# Patient Record
Sex: Female | Born: 1950 | Race: Black or African American | Hispanic: No | Marital: Married | Smoking: Never smoker
Health system: Southern US, Community
[De-identification: ages and names within clinical notes are randomized; demographics above are authoritative.]

## PROBLEM LIST (undated history)

## (undated) DIAGNOSIS — J302 Other seasonal allergic rhinitis: Secondary | ICD-10-CM

## (undated) DIAGNOSIS — N950 Postmenopausal bleeding: Secondary | ICD-10-CM

## (undated) DIAGNOSIS — D219 Benign neoplasm of connective and other soft tissue, unspecified: Secondary | ICD-10-CM

## (undated) DIAGNOSIS — Z789 Other specified health status: Secondary | ICD-10-CM

## (undated) DIAGNOSIS — G43909 Migraine, unspecified, not intractable, without status migrainosus: Secondary | ICD-10-CM

## (undated) DIAGNOSIS — M1990 Unspecified osteoarthritis, unspecified site: Secondary | ICD-10-CM

## (undated) DIAGNOSIS — B019 Varicella without complication: Secondary | ICD-10-CM

## (undated) DIAGNOSIS — T7840XA Allergy, unspecified, initial encounter: Secondary | ICD-10-CM

## (undated) HISTORY — DX: Migraine, unspecified, not intractable, without status migrainosus: G43.909

## (undated) HISTORY — DX: Unspecified osteoarthritis, unspecified site: M19.90

## (undated) HISTORY — DX: Benign neoplasm of connective and other soft tissue, unspecified: D21.9

## (undated) HISTORY — DX: Postmenopausal bleeding: N95.0

## (undated) HISTORY — DX: Varicella without complication: B01.9

## (undated) HISTORY — PX: ENDOMETRIAL BIOPSY: SHX622

## (undated) HISTORY — PX: MYOMECTOMY: SHX85

## (undated) HISTORY — PX: WISDOM TOOTH EXTRACTION: SHX21

## (undated) HISTORY — DX: Allergy, unspecified, initial encounter: T78.40XA

---

## 1998-07-06 ENCOUNTER — Other Ambulatory Visit: Admission: RE | Admit: 1998-07-06 | Discharge: 1998-07-06 | Payer: Self-pay | Admitting: Obstetrics and Gynecology

## 2000-01-08 ENCOUNTER — Encounter (INDEPENDENT_AMBULATORY_CARE_PROVIDER_SITE_OTHER): Payer: Self-pay | Admitting: Specialist

## 2000-01-08 ENCOUNTER — Other Ambulatory Visit: Admission: RE | Admit: 2000-01-08 | Discharge: 2000-01-08 | Payer: Self-pay | Admitting: Obstetrics and Gynecology

## 2001-10-09 ENCOUNTER — Other Ambulatory Visit: Admission: RE | Admit: 2001-10-09 | Discharge: 2001-10-09 | Payer: Self-pay | Admitting: Obstetrics and Gynecology

## 2004-06-03 LAB — HM COLONOSCOPY

## 2004-10-08 ENCOUNTER — Other Ambulatory Visit: Admission: RE | Admit: 2004-10-08 | Discharge: 2004-10-08 | Payer: Self-pay | Admitting: Obstetrics and Gynecology

## 2004-10-15 ENCOUNTER — Ambulatory Visit: Payer: Self-pay | Admitting: Family Medicine

## 2004-10-15 ENCOUNTER — Encounter: Admission: RE | Admit: 2004-10-15 | Discharge: 2004-10-15 | Payer: Self-pay | Admitting: Family Medicine

## 2004-10-23 ENCOUNTER — Ambulatory Visit: Payer: Self-pay

## 2004-11-07 ENCOUNTER — Ambulatory Visit: Payer: Self-pay | Admitting: Family Medicine

## 2005-01-07 ENCOUNTER — Ambulatory Visit: Payer: Self-pay | Admitting: Family Medicine

## 2005-10-09 ENCOUNTER — Other Ambulatory Visit: Admission: RE | Admit: 2005-10-09 | Discharge: 2005-10-09 | Payer: Self-pay | Admitting: Obstetrics and Gynecology

## 2009-06-15 ENCOUNTER — Ambulatory Visit (HOSPITAL_BASED_OUTPATIENT_CLINIC_OR_DEPARTMENT_OTHER): Admission: RE | Admit: 2009-06-15 | Discharge: 2009-06-15 | Payer: Self-pay | Admitting: Ophthalmology

## 2011-04-04 HISTORY — PX: HYSTEROSCOPY: SHX211

## 2011-04-15 ENCOUNTER — Other Ambulatory Visit: Payer: Self-pay | Admitting: Obstetrics and Gynecology

## 2011-04-18 ENCOUNTER — Encounter (HOSPITAL_COMMUNITY): Payer: Self-pay | Admitting: *Deleted

## 2011-04-19 ENCOUNTER — Encounter (HOSPITAL_COMMUNITY): Payer: Self-pay | Admitting: Pharmacy Technician

## 2011-04-22 NOTE — H&P (Signed)
NAMEJAYCIE, KREGEL              ACCOUNT NO.:  [id number]  MEDICAL RECORD NO.:  [id number]  LOCATION:  PERIO                         FACILITY:  [facility]  PHYSICIAN:  Janine Limbo, M.D.DATE OF BIRTH:  1951/03/13  DATE OF ADMISSION:  04/15/2011 DATE OF DISCHARGE:                             HISTORY & PHYSICAL   HISTORY OF PRESENT ILLNESS:  Ms. Mcreynolds is a 60 year old female, para 0-1-1-1, who presents for hysteroscopy with resection.  She will also have a dilatation and curettage.  The patient has been followed at the [facility] and Gy[facility]cology [facility] of [facility].  She complains of postmenopausal bleeding.  A hydrosonogram showed a 7.48 x 4.75 cm uterus.  Fibroids were noted with the largest fibroid measuring 3.5 cm.  An endometrial biopsy was within normal limits.  The patient was found to have an endometrial polyp.  A large endocervical polyp was removed.  The patient had a negative endometrial biopsy in 2008.  Her most recent Pap smear was within normal limits.  OBSTETRICAL HISTORY:  The patient has had 1 preterm vaginal delivery and she has had 1 miscarriage.  SOCIAL HISTORY:  The patient denies cigarette use, alcohol use, and recreational drug use.  PAST MEDICAL HISTORY:  The patient has had her wisdom teeth removed. She wears glasses.  FAMILY HISTORY:  Noncontributory.  DRUG ALLERGIES:  No known drug allergies.  PHYSICAL EXAMINATION:  VITAL SIGNS:  Height is 5 feet 3 inches.  Weight is 194 pounds. HEENT:  Within normal limits. CHEST:  Clear. HEART:  Regular rate and rhythm. BREASTS:  Without masses. ABDOMEN:  Nontender. EXTREMITIES:  Grossly normal. NEUROLOGIC:  Grossly normal. PELVIC:  External genitalia is normal.  The vagina is normal.  The cervix is nontender.  A large endocervical polyp was present but this has now been removed.  The uterus is 8-10 weeks size and irregular. Adnexa, no masses and rectovaginal exam  confirms.  ASSESSMENT: 1. Postmenopausal bleeding. 2. Fibroid uterus. 3. Endometrial polyp.  PLAN:  The patient will undergo hysteroscopy with resection of endometrial polyps.  She will also have a dilatation and curettage.  She understands the indications for her surgical procedure, and she accepts the risks of, but not limited to, anesthetic complications, bleeding, infections, and possible damage to the surrounding organs.     Janine Limbo, M.D.     AVS/MEDQ  D:  04/22/2011  T:  04/22/2011  Job:  [id number]

## 2011-04-24 ENCOUNTER — Other Ambulatory Visit: Payer: Self-pay | Admitting: Obstetrics and Gynecology

## 2011-04-24 ENCOUNTER — Encounter (HOSPITAL_COMMUNITY)
Admission: RE | Disposition: A | Discharge status: Home / Self Care | Payer: Self-pay | Source: Ambulatory Visit | Attending: Obstetrics and Gynecology

## 2011-04-24 ENCOUNTER — Ambulatory Visit (HOSPITAL_COMMUNITY): Payer: BC Managed Care – PPO | Admitting: Anesthesiology

## 2011-04-24 ENCOUNTER — Encounter (HOSPITAL_COMMUNITY): Payer: Self-pay | Admitting: *Deleted

## 2011-04-24 ENCOUNTER — Ambulatory Visit (HOSPITAL_COMMUNITY)
Admission: RE | Admit: 2011-04-24 | Discharge: 2011-04-24 | Disposition: A | Discharge status: Home / Self Care | Payer: BC Managed Care – PPO | Source: Ambulatory Visit | Attending: Obstetrics and Gynecology | Admitting: Obstetrics and Gynecology

## 2011-04-24 ENCOUNTER — Encounter (HOSPITAL_COMMUNITY): Payer: Self-pay | Admitting: Anesthesiology

## 2011-04-24 DIAGNOSIS — D259 Leiomyoma of uterus, unspecified: Secondary | ICD-10-CM | POA: Insufficient documentation

## 2011-04-24 DIAGNOSIS — N840 Polyp of corpus uteri: Secondary | ICD-10-CM | POA: Insufficient documentation

## 2011-04-24 DIAGNOSIS — N950 Postmenopausal bleeding: Secondary | ICD-10-CM | POA: Insufficient documentation

## 2011-04-24 HISTORY — DX: Other seasonal allergic rhinitis: J30.2

## 2011-04-24 HISTORY — DX: Other specified health status: Z78.9

## 2011-04-24 LAB — CBC
HCT: 41.2 % (ref 36.0–46.0)
MCH: 29.8 pg (ref 26.0–34.0)
MCHC: 32.3 g/dL (ref 30.0–36.0)
MCV: 92.2 fL (ref 78.0–100.0)
Platelets: 283 10*3/uL (ref 150–400)
RDW: 14.3 % (ref 11.5–15.5)

## 2011-04-24 SURGERY — DILATATION & CURETTAGE/HYSTEROSCOPY WITH RESECTOCOPE
Anesthesia: General | Site: Vagina | Wound class: Clean Contaminated

## 2011-04-24 MED ORDER — MIDAZOLAM HCL 5 MG/5ML IJ SOLN
INTRAMUSCULAR | Status: DC | PRN
  Administered 2011-04-24: 2 mg via INTRAVENOUS

## 2011-04-24 MED ORDER — FENTANYL CITRATE 0.05 MG/ML IJ SOLN: 25.0000 ug | INTRAMUSCULAR | Status: DC | PRN

## 2011-04-24 MED ORDER — DEXAMETHASONE SODIUM PHOSPHATE 10 MG/ML IJ SOLN
INTRAMUSCULAR | Status: AC
  Filled 2011-04-24: qty 1

## 2011-04-24 MED ORDER — MIDAZOLAM HCL 2 MG/2ML IJ SOLN
INTRAMUSCULAR | Status: AC
  Filled 2011-04-24: qty 2

## 2011-04-24 MED ORDER — IBUPROFEN 200 MG PO TABS: 800.0000 mg | ORAL_TABLET | Freq: Three times a day (TID) | ORAL | Status: AC | PRN

## 2011-04-24 MED ORDER — ACETAMINOPHEN 325 MG PO TABS: 325.0000 mg | ORAL_TABLET | ORAL | Status: DC | PRN

## 2011-04-24 MED ORDER — KETOROLAC TROMETHAMINE 60 MG/2ML IM SOLN
INTRAMUSCULAR | Status: DC | PRN
  Administered 2011-04-24: 30 mg via INTRAMUSCULAR

## 2011-04-24 MED ORDER — BUPIVACAINE-EPINEPHRINE 0.5% -1:200000 IJ SOLN
INTRAMUSCULAR | Status: DC | PRN
  Administered 2011-04-24: 10 mL

## 2011-04-24 MED ORDER — PROPOFOL 10 MG/ML IV EMUL
INTRAVENOUS | Status: DC | PRN
  Administered 2011-04-24: 200 mg via INTRAVENOUS
  Administered 2011-04-24: 100 mg via INTRAVENOUS

## 2011-04-24 MED ORDER — FENTANYL CITRATE 0.05 MG/ML IJ SOLN
INTRAMUSCULAR | Status: DC | PRN
  Administered 2011-04-24: 100 ug via INTRAVENOUS

## 2011-04-24 MED ORDER — PROMETHAZINE HCL 25 MG/ML IJ SOLN: 6.2500 mg | INTRAMUSCULAR | Status: DC | PRN

## 2011-04-24 MED ORDER — LACTATED RINGERS IV SOLN
INTRAVENOUS | Status: DC
  Administered 2011-04-24: 50 mL/h via INTRAVENOUS
  Administered 2011-04-24: 125 mL/h via INTRAVENOUS
  Administered 2011-04-24: 14:00:00 via INTRAVENOUS

## 2011-04-24 MED ORDER — GLYCOPYRROLATE 0.2 MG/ML IJ SOLN
INTRAMUSCULAR | Status: DC | PRN
  Administered 2011-04-24: .4 mg via INTRAVENOUS

## 2011-04-24 MED ORDER — ONDANSETRON HCL 4 MG/2ML IJ SOLN
INTRAMUSCULAR | Status: AC
  Filled 2011-04-24: qty 2

## 2011-04-24 MED ORDER — FENTANYL CITRATE 0.05 MG/ML IJ SOLN
INTRAMUSCULAR | Status: AC
  Filled 2011-04-24: qty 2

## 2011-04-24 MED ORDER — KETOROLAC TROMETHAMINE 30 MG/ML IJ SOLN
INTRAMUSCULAR | Status: DC | PRN
  Administered 2011-04-24: 30 mg via INTRAVENOUS

## 2011-04-24 MED ORDER — KETOROLAC TROMETHAMINE 30 MG/ML IJ SOLN: 15.0000 mg | Freq: Once | INTRAMUSCULAR | Status: DC | PRN

## 2011-04-24 MED ORDER — PROMETHAZINE HCL 12.5 MG PO TABS: 12.5000 mg | ORAL_TABLET | Freq: Four times a day (QID) | ORAL | Status: AC | PRN

## 2011-04-24 MED ORDER — HYDROCODONE-ACETAMINOPHEN 5-500 MG PO TABS: 1.0000 | ORAL_TABLET | ORAL | Status: AC | PRN

## 2011-04-24 MED ORDER — GLYCINE 1.5 % IR SOLN
Status: DC | PRN
  Administered 2011-04-24: 3000 mL

## 2011-04-24 SURGICAL SUPPLY — 15 items
CANISTER SUCTION 2500CC (MISCELLANEOUS) ×2 IMPLANT
CATH ROBINSON RED A/P 16FR (CATHETERS) ×2 IMPLANT
CLOTH BEACON ORANGE TIMEOUT ST (SAFETY) ×2 IMPLANT
CONTAINER PREFILL 10% NBF 60ML (FORM) ×5 IMPLANT
DRAPE UTILITY XL STRL (DRAPES) ×1 IMPLANT
ELECT REM PT RETURN 9FT ADLT (ELECTROSURGICAL) ×2
ELECTRODE REM PT RTRN 9FT ADLT (ELECTROSURGICAL) IMPLANT
GLOVE BIOGEL PI IND STRL 8.5 (GLOVE) ×1 IMPLANT
GLOVE BIOGEL PI INDICATOR 8.5 (GLOVE) ×1
GLOVE ECLIPSE 8.0 STRL XLNG CF (GLOVE) ×4 IMPLANT
GOWN PREVENTION PLUS LG XLONG (DISPOSABLE) ×2 IMPLANT
LOOP ANGLED CUTTING 22FR (CUTTING LOOP) ×1 IMPLANT
PACK HYSTEROSCOPY LF (CUSTOM PROCEDURE TRAY) ×2 IMPLANT
TOWEL OR 17X24 6PK STRL BLUE (TOWEL DISPOSABLE) ×4 IMPLANT
WATER STERILE IRR 1000ML POUR (IV SOLUTION) ×2 IMPLANT

## 2011-04-24 NOTE — Transfer of Care (Signed)
Immediate Anesthesia Transfer of Care Note  Patient: Melissa Buchanan  Procedure(s) Performed:  DILATATION & CURETTAGE/HYSTEROSCOPY WITH RESECTOSCOPE  Patient Location: PACU  Anesthesia Type: General  Level of Consciousness: sedated  Airway & Oxygen Therapy: Patient Spontanous Breathing  Post-op Assessment: Report given to PACU RN  Post vital signs: stable  Complications: No apparent anesthesia complications

## 2011-04-24 NOTE — Anesthesia Postprocedure Evaluation (Signed)
Anesthesia Post Note  Patient: Melissa Buchanan  Procedure(s) Performed:  DILATATION & CURETTAGE/HYSTEROSCOPY WITH RESECTOSCOPE  Anesthesia type: General  Patient location: PACU  Post pain: Pain level controlled  Post assessment: Post-op Vital signs reviewed  Last Vitals:  Filed Vitals:   04/24/11 1530  BP: 130/73  Pulse: 77  Temp:   Resp: 16    Post vital signs: Reviewed  Level of consciousness: sedated  Complications: No apparent anesthesia complications

## 2011-04-24 NOTE — Op Note (Addendum)
OPERATIVE NOTE  Melissa Buchanan  DOB:    Jan 01, 1951  MRN:    [id number]  CSN:    [id number]  Date of Surgery:  04/24/2011  Preoperative Diagnosis:  Postmenopausal bleeding  Endometrial polyps  Postoperative Diagnosis:  Same  Procedure:  Hysteroscopy  Hysteroscopic resection of endometrial polyps  Dilatation and curettage  Surgeon:  Leonard Schwartz, M.D.  Assistant:  None  Anesthetic:  General  Disposition:  The patient is a 60 year old female who presents with postmenopausal bleeding. A sono hysterogram showed endometrial polyps. The patient understands the indications for her surgical procedure as well as her alternative treatment options. She accepts the risk of, but not limited to, anesthetic complications, bleeding, infections, and possible damage to the surrounding organs.  Findings:  On examination under anesthesia the patient was noted to have a cystocele and rectocele. The uterus is upper limits normal size. No adnexal masses were appreciated. On hysteroscopy the patient was found to have at least 3 endometrial polyps. The largest polyp measured less than 1 cm in size. The uterus sounded to 7 cm. No other endometrial pathology was appreciated.  Procedure:  The patient was taken to the operating room where a general anesthetic was given. The patient's abdomen, perineum, and vagina were prepped with multiple layers of Betadine. The bladder was drained of urine. The patient was sterilely draped. An examination under anesthesia was performed. Findings are mentioned above. A paracervical block was placed using 10 cc of half percent Marcaine with epinephrine. An endocervical curettage was performed. The uterus sounded to 7-8 cm. The cervix was gently dilated. The diagnostic hysteroscope was inserted. The cavity was inspected with findings as mentioned above. The diagnostic hysteroscope was removed and the cervix was dilated further. The operative  hysteroscope was inserted. A single loop was used to resect all endometrial polyps. Hemostasis was adequate. Care was taken to not penetrate too deeply into the myometrium. The operative hysteroscope was removed. The cavity was then curetted using a medium sharp curet. The cavity was felt to be clean at the end of our procedure. Hemostasis was confirmed. The hysteroscope was once again inserted and the cavity was inspected. No other pathology was seen. All instruments were removed. The patient's exam was repeated and the uterus was noted to be firm. The patient will return to the supine position. She was awakened from her anesthetic without difficulty. She was transported to the recovery room in stable condition. The endocervical curettings, endometrial resections, and endometrial curettings were sent to pathology.  The patient will return to see Dr. Stefano Gaul in 2 weeks for followup examination. She was given a prescription for Motrin, Vicodin, and Phenergan. She will call for questions or concerns.  Leonard Schwartz, M.D.

## 2011-04-24 NOTE — Anesthesia Preprocedure Evaluation (Addendum)
Anesthesia Evaluation  Patient identified by MRN, date of birth, ID band Patient awake    Reviewed: Allergy & Precautions, H&P , Patient's Chart, lab work & pertinent test results, reviewed documented beta blocker date and time   History of Anesthesia Complications Negative for: history of anesthetic complications  Airway Mallampati: II TM Distance: >3 FB Neck ROM: full    Dental No notable dental hx.    Pulmonary neg pulmonary ROS,  clear to auscultation  Pulmonary exam normal       Cardiovascular Exercise Tolerance: Good neg cardio ROS regular Normal    Neuro/Psych Negative Neurological ROS  Negative Psych ROS   GI/Hepatic negative GI ROS, Neg liver ROS,   Endo/Other  Negative Endocrine ROS  Renal/GU negative Renal ROS     Musculoskeletal   Abdominal   Peds  Hematology negative hematology ROS (+)   Anesthesia Other Findings   Reproductive/Obstetrics negative OB ROS                           Anesthesia Physical Anesthesia Plan  ASA: I  Anesthesia Plan: General   Post-op Pain Management:    Induction:   Airway Management Planned:   Additional Equipment:   Intra-op Plan:   Post-operative Plan:   Informed Consent: I have reviewed the patients History and Physical, chart, labs and discussed the procedure including the risks, benefits and alternatives for the proposed anesthesia with the patient or authorized representative who has indicated his/her understanding and acceptance.   Dental Advisory Given  Plan Discussed with: CRNA and Surgeon  Anesthesia Plan Comments:        nausea and diarrhea with eggs.  Discussed alternative induction medications and she thinks that her egg allergy is more of an aversion and would like to proceed with propofol.  We will test dose preop and monitor for early signs and symptoms. Anesthesia Quick Evaluation

## 2011-04-24 NOTE — Anesthesia Procedure Notes (Addendum)
Procedure Name: LMA Insertion Performed by: Scarlet Abad MARIE Pre-anesthesia Checklist: Patient identified, Patient being monitored, Emergency Drugs available, Timeout performed and Suction available Patient Re-evaluated:Patient Re-evaluated prior to inductionOxygen Delivery Method: Circle System Utilized Preoxygenation: Pre-oxygenation with 100% oxygen Intubation Type: IV induction Ventilation: Mask ventilation without difficulty LMA: LMA inserted LMA Size: 4.0 Tube type: Oral Number of attempts: 1 Dental Injury: Teeth and Oropharynx as per pre-operative assessment

## 2011-08-15 ENCOUNTER — Encounter (HOSPITAL_BASED_OUTPATIENT_CLINIC_OR_DEPARTMENT_OTHER): Payer: Self-pay | Admitting: *Deleted

## 2011-08-15 ENCOUNTER — Emergency Department (INDEPENDENT_AMBULATORY_CARE_PROVIDER_SITE_OTHER): Payer: No Typology Code available for payment source

## 2011-08-15 ENCOUNTER — Emergency Department (HOSPITAL_BASED_OUTPATIENT_CLINIC_OR_DEPARTMENT_OTHER)
Admission: EM | Admit: 2011-08-15 | Discharge: 2011-08-16 | Disposition: A | Discharge status: Home / Self Care | Payer: No Typology Code available for payment source | Attending: Emergency Medicine | Admitting: Emergency Medicine

## 2011-08-15 DIAGNOSIS — M545 Low back pain, unspecified: Secondary | ICD-10-CM | POA: Insufficient documentation

## 2011-08-15 DIAGNOSIS — S335XXA Sprain of ligaments of lumbar spine, initial encounter: Secondary | ICD-10-CM | POA: Insufficient documentation

## 2011-08-15 DIAGNOSIS — S139XXA Sprain of joints and ligaments of unspecified parts of neck, initial encounter: Secondary | ICD-10-CM | POA: Insufficient documentation

## 2011-08-15 DIAGNOSIS — M542 Cervicalgia: Secondary | ICD-10-CM

## 2011-08-15 DIAGNOSIS — S39012A Strain of muscle, fascia and tendon of lower back, initial encounter: Secondary | ICD-10-CM

## 2011-08-15 DIAGNOSIS — S161XXA Strain of muscle, fascia and tendon at neck level, initial encounter: Secondary | ICD-10-CM

## 2011-08-15 DIAGNOSIS — M2569 Stiffness of other specified joint, not elsewhere classified: Secondary | ICD-10-CM | POA: Insufficient documentation

## 2011-08-15 MED ORDER — NAPROXEN 500 MG PO TABS: 500.0000 mg | ORAL_TABLET | Freq: Two times a day (BID) | ORAL | Status: AC

## 2011-08-15 MED ORDER — HYDROCODONE-ACETAMINOPHEN 5-325 MG PO TABS
1.0000 | ORAL_TABLET | Freq: Four times a day (QID) | ORAL | Status: AC | PRN
Start: 2011-08-15 — End: 2011-08-25

## 2011-08-15 MED ORDER — HYDROCODONE-ACETAMINOPHEN 5-325 MG PO TABS
1.0000 | ORAL_TABLET | Freq: Once | ORAL | Status: AC
  Administered 2011-08-15: 1 via ORAL
  Filled 2011-08-15: qty 1

## 2011-08-15 NOTE — Discharge Instructions (Signed)
X-rays negative for any bony fractures to the neck or low back. Rest. Take Naprosyn and hydrocodone as needed for pain. Return for new or worse symptoms particularly if you develop abdominal pain and persistent vomiting. If not getting better in 4-5 days recommend followup with orthopedics or your regular doctor. Orthopedic referral made.
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
## 2012-02-05 ENCOUNTER — Encounter: Payer: Self-pay | Admitting: Obstetrics and Gynecology

## 2012-02-05 ENCOUNTER — Telehealth: Payer: Self-pay | Admitting: Obstetrics and Gynecology

## 2012-02-05 ENCOUNTER — Ambulatory Visit (INDEPENDENT_AMBULATORY_CARE_PROVIDER_SITE_OTHER): Payer: BC Managed Care – PPO | Admitting: Obstetrics and Gynecology

## 2012-02-05 VITALS — BP 112/74 | Temp 97.6°F | Wt 182.0 lb

## 2012-02-05 DIAGNOSIS — Z808 Family history of malignant neoplasm of other organs or systems: Secondary | ICD-10-CM

## 2012-02-05 DIAGNOSIS — D235 Other benign neoplasm of skin of trunk: Secondary | ICD-10-CM

## 2012-02-05 DIAGNOSIS — D225 Melanocytic nevi of trunk: Secondary | ICD-10-CM

## 2012-02-05 NOTE — Telephone Encounter (Signed)
TC to pt. Sched for eval with EP today.

## 2012-02-05 NOTE — Telephone Encounter (Signed)
TC to pt. States has mole on her breast that appears purple. Just noticed today but is not sure how long has been present.  No tenderness or pain. Was scheduled for 1st available with EP 02/13/12.  Pt requests appt be cancelled. Will schedule with PCP.

## 2012-02-05 NOTE — Progress Notes (Signed)
[name] noticed a new mole on right breast this a.m.  Denies any symptoms related to this lesion but has a brother with a history of melanoma.   Right breast: 5 mm x 5 mm, macular purpulish lesion with irregular border, asymmetric coloring but no evidence of scaling, drainage or bleeding; no masses, nipple discharge, retractions or axillary adenopathy.  Left breast: no masses, nipple discharge retractions, skin changes or adenopathy.   A: New Right Breast Nevus     First degree relative-melanoma  P: Refer to [facility] (patient already a patient there)     (Patient had an appointment with them on tomorrow but cancelled it)  Bristol Osentoski, PA-C

## 2012-02-13 ENCOUNTER — Encounter: Payer: Self-pay | Admitting: Obstetrics and Gynecology

## 2012-02-25 HISTORY — PX: BUNIONECTOMY: SHX129

## 2012-04-06 ENCOUNTER — Encounter: Payer: Self-pay | Admitting: Obstetrics and Gynecology

## 2012-04-06 ENCOUNTER — Ambulatory Visit (INDEPENDENT_AMBULATORY_CARE_PROVIDER_SITE_OTHER): Payer: BC Managed Care – PPO | Admitting: Obstetrics and Gynecology

## 2012-04-06 VITALS — BP 120/70 | HR 70 | Resp 16 | Ht 63.0 in | Wt 182.0 lb

## 2012-04-06 DIAGNOSIS — Z124 Encounter for screening for malignant neoplasm of cervix: Secondary | ICD-10-CM

## 2012-04-06 DIAGNOSIS — Z01419 Encounter for gynecological examination (general) (routine) without abnormal findings: Secondary | ICD-10-CM
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
## 2012-04-08 LAB — PAP IG W/ RFLX HPV ASCU

## 2012-10-20 ENCOUNTER — Ambulatory Visit: Payer: BC Managed Care – PPO | Admitting: Family Medicine

## 2013-03-11 ENCOUNTER — Other Ambulatory Visit: Payer: Self-pay | Admitting: *Deleted

## 2013-03-11 DIAGNOSIS — Z0000 Encounter for general adult medical examination without abnormal findings: Secondary | ICD-10-CM

## 2013-03-12 ENCOUNTER — Other Ambulatory Visit: Payer: BC Managed Care – PPO

## 2013-03-12 LAB — CBC WITH DIFFERENTIAL/PLATELET
Basophils Absolute: 0 10*3/uL (ref 0.0–0.1)
Basophils Relative: 0 % (ref 0–1)
Eosinophils Absolute: 0.1 10*3/uL (ref 0.0–0.7)
Eosinophils Relative: 1 % (ref 0–5)
HCT: 38.7 % (ref 36.0–46.0)
Hemoglobin: 13 g/dL (ref 12.0–15.0)
Lymphocytes Relative: 34 % (ref 12–46)
Lymphs Abs: 2.4 10*3/uL (ref 0.7–4.0)
MCH: 29.8 pg (ref 26.0–34.0)
MCHC: 33.6 g/dL (ref 30.0–36.0)
MCV: 88.8 fL (ref 78.0–100.0)
Monocytes Absolute: 0.6 10*3/uL (ref 0.1–1.0)
Monocytes Relative: 8 % (ref 3–12)
Neutro Abs: 4.2 10*3/uL (ref 1.7–7.7)
Neutrophils Relative %: 57 % (ref 43–77)
Platelets: 293 10*3/uL (ref 150–400)
RBC: 4.36 MIL/uL (ref 3.87–5.11)
RDW: 13.9 % (ref 11.5–15.5)
WBC: 7.3 10*3/uL (ref 4.0–10.5)

## 2013-03-12 LAB — COMPLETE METABOLIC PANEL WITH GFR
ALT: 11 U/L (ref 0–35)
AST: 15 U/L (ref 0–37)
Albumin: 3.8 g/dL (ref 3.5–5.2)
Alkaline Phosphatase: 72 U/L (ref 39–117)
BUN: 12 mg/dL (ref 6–23)
CO2: 28 mEq/L (ref 19–32)
Calcium: 8.9 mg/dL (ref 8.4–10.5)
Chloride: 106 mEq/L (ref 96–112)
Creat: 0.91 mg/dL (ref 0.50–1.10)
GFR, Est African American: 79 mL/min
GFR, Est Non African American: 68 mL/min
Glucose, Bld: 89 mg/dL (ref 70–99)
Potassium: 4.2 mEq/L (ref 3.5–5.3)
Sodium: 142 mEq/L (ref 135–145)
Total Bilirubin: 0.7 mg/dL (ref 0.3–1.2)
Total Protein: 6.4 g/dL (ref 6.0–8.3)

## 2013-03-12 LAB — LIPID PANEL
Cholesterol: 141 mg/dL (ref 0–200)
HDL: 53 mg/dL (ref >39)
LDL Cholesterol: 77 mg/dL (ref 0–99)
Total CHOL/HDL Ratio: 2.7 Ratio
Triglycerides: 56 mg/dL (ref <150)
VLDL: 11 mg/dL (ref 0–40)

## 2013-03-12 LAB — TSH: TSH: 1.404 u[IU]/mL (ref 0.350–4.500)

## 2013-03-31 ENCOUNTER — Encounter: Payer: Self-pay | Admitting: Family Medicine

## 2013-03-31 ENCOUNTER — Ambulatory Visit (INDEPENDENT_AMBULATORY_CARE_PROVIDER_SITE_OTHER): Payer: BC Managed Care – PPO | Admitting: Family Medicine

## 2013-03-31 ENCOUNTER — Encounter (INDEPENDENT_AMBULATORY_CARE_PROVIDER_SITE_OTHER): Payer: Self-pay

## 2013-03-31 VITALS — BP 117/85 | HR 69 | Resp 16 | Ht 62.25 in | Wt 198.0 lb

## 2013-03-31 DIAGNOSIS — Z0000 Encounter for general adult medical examination without abnormal findings: Secondary | ICD-10-CM

## 2013-03-31 DIAGNOSIS — Z2911 Encounter for prophylactic immunotherapy for respiratory syncytial virus (RSV): Secondary | ICD-10-CM

## 2013-03-31 DIAGNOSIS — J309 Allergic rhinitis, unspecified: Secondary | ICD-10-CM

## 2013-03-31 DIAGNOSIS — Z23 Encounter for immunization: Secondary | ICD-10-CM

## 2013-03-31 LAB — POCT URINALYSIS DIPSTICK
Bilirubin, UA: NEGATIVE
Blood, UA: NEGATIVE
Glucose, UA: NEGATIVE
Ketones, UA: NEGATIVE
Leukocytes, UA: NEGATIVE
Nitrite, UA: NEGATIVE
Protein, UA: NEGATIVE
Spec Grav, UA: 1.02
Urobilinogen, UA: NEGATIVE
pH, UA: 6

## 2013-03-31 MED ORDER — MOMETASONE FUROATE 50 MCG/ACT NA SUSP: 2.0000 | Freq: Every day | NASAL | Status: DC | PRN

## 2013-03-31 NOTE — Progress Notes (Signed)
Subjective:    Patient ID: Melissa Buchanan, female    DOB: [birth year], 62 y.o.   MRN: [id number]  HPI  Glenn is here today for her annual CPE.  She has done well since her last office visit.  She is in need of a few vaccinations.     Review of Systems  Constitutional: Negative.   HENT: Negative.   Eyes: Negative.   Respiratory: Negative.   Cardiovascular: Negative.   Gastrointestinal: Negative.   Endocrine: Negative.   Genitourinary: Negative.   Musculoskeletal: Negative.   Skin: Negative.   Allergic/Immunologic: Negative.   Neurological: Negative.   Hematological: Negative.   Psychiatric/Behavioral: Negative.      Past Medical History  Diagnosis Date  . Seasonal allergies   . No pertinent past medical history   . Fibroids     H/O  . PMB (postmenopausal bleeding)     H/O     Family History  Problem Relation Age of Onset  . Stroke Mother   . Heart attack Mother   . Cancer Brother     MELANOMA     History   Social History Narrative   Marital Status: Married Isaias Cowman)   Children:  Daughter Melvenia Beam)    Pets: None    Living Situation: Lives with her husband.     Occupation:  Retired     Education: Engineer, maintenance (IT)   Tobacco Use/Exposure:  None    Alcohol Use:  Occasional   Drug Use:  None   Diet:  Regular   Exercise:  None   Hobbies: Bible Study       Objective:   Physical Exam  Vitals reviewed. Constitutional: She is oriented to person, place, and time. She appears well-developed and well-nourished.  HENT:  Head: Normocephalic and atraumatic.  Right Ear: External ear normal.  Left Ear: External ear normal.  Nose: Nose normal.  Mouth/Throat: Oropharynx is clear and moist.  Eyes: Conjunctivae are normal. Pupils are equal, round, and reactive to light. No scleral icterus.  Neck: Normal range of motion. Neck supple. No thyromegaly present.  Cardiovascular: Normal rate, regular rhythm, normal heart sounds and intact distal pulses.  Exam reveals no  gallop and no friction rub.   No murmur heard. Pulmonary/Chest: Effort normal and breath sounds normal. Right breast exhibits no inverted nipple, no mass, no nipple discharge, no skin change and no tenderness. Left breast exhibits no inverted nipple, no mass, no nipple discharge, no skin change and no tenderness. Breasts are symmetrical.  Abdominal: Soft. Bowel sounds are normal.  Musculoskeletal: Normal range of motion. She exhibits no edema and no tenderness.  Lymphadenopathy:    She has no cervical adenopathy.  Neurological: She is alert and oriented to person, place, and time. She has normal reflexes.  Skin: Skin is warm and dry.  Psychiatric: She has a normal mood and affect. Her behavior is normal. Judgment and thought content normal.     Assessment & Plan:    Damia was seen today for annual exam.  Diagnoses and associated orders for this visit:  Routine general medical examination at a health care facility - POCT urinalysis dipstick - EKG 12-Lead  Need for prophylactic vaccination and inoculation against influenza  Need for prophylactic vaccination with combined diphtheria-tetanus-pertussis (DTP) vaccine - Tdap vaccine greater than or equal to 7yo IM  Need for prophylactic vaccination and inoculation against other viral diseases(V04.89) - Varicella-zoster vaccine subcutaneous  Allergic rhinitis - mometasone (NASONEX) 50 MCG/ACT nasal spray; Place 2 sprays into the  nose daily as needed.

## 2013-03-31 NOTE — Patient Instructions (Signed)
Preventive Care for Adults, Female A healthy lifestyle and preventive care can promote health and wellness. Preventive health guidelines for women include the following key practices.  A routine yearly physical is a good way to check with your caregiver about your health and preventive screening. It is a chance to share any concerns and updates on your health, and to receive a thorough exam.  Visit your dentist for a routine exam and preventive care every 6 months. Brush your teeth twice a day and floss once a day. Good oral hygiene prevents tooth decay and gum disease.  The frequency of eye exams is based on your age, health, family medical history, use of contact lenses, and other factors. Follow your caregiver's recommendations for frequency of eye exams.  Eat a healthy diet. Foods like vegetables, fruits, whole grains, low-fat dairy products, and lean protein foods contain the nutrients you need without too many calories. Decrease your intake of foods high in solid fats, added sugars, and salt. Eat the right amount of calories for you.Get information about a proper diet from your caregiver, if necessary.  Regular physical exercise is one of the most important things you can do for your health. Most adults should get at least 150 minutes of moderate-intensity exercise (any activity that increases your heart rate and causes you to sweat) each week. In addition, most adults need muscle-strengthening exercises on 2 or more days a week.  Maintain a healthy weight. The body mass index (BMI) is a screening tool to identify possible weight problems. It provides an estimate of body fat based on height and weight. Your caregiver can help determine your BMI, and can help you achieve or maintain a healthy weight.For adults 20 years and older:  A BMI below 18.5 is considered underweight.  A BMI of 18.5 to 24.9 is normal.  A BMI of 25 to 29.9 is considered overweight.  A BMI of 30 and above is  considered obese.  Maintain normal blood lipids and cholesterol levels by exercising and minimizing your intake of saturated fat. Eat a balanced diet with plenty of fruit and vegetables. Blood tests for lipids and cholesterol should begin at age 20 and be repeated every 5 years. If your lipid or cholesterol levels are high, you are over 50, or you are at high risk for heart disease, you may need your cholesterol levels checked more frequently.Ongoing high lipid and cholesterol levels should be treated with medicines if diet and exercise are not effective.  If you smoke, find out from your caregiver how to quit. If you do not use tobacco, do not start.  If you are pregnant, do not drink alcohol. If you are breastfeeding, be very cautious about drinking alcohol. If you are not pregnant and choose to drink alcohol, do not exceed 1 drink per day. One drink is considered to be 12 ounces (355 mL) of beer, 5 ounces (148 mL) of wine, or 1.5 ounces (44 mL) of liquor.  Avoid use of street drugs. Do not share needles with anyone. Ask for help if you need support or instructions about stopping the use of drugs.  High blood pressure causes heart disease and increases the risk of stroke. Your blood pressure should be checked at least every 1 to 2 years. Ongoing high blood pressure should be treated with medicines if weight loss and exercise are not effective.  If you are 55 to 61 years old, ask your caregiver if you should take aspirin to prevent strokes.  Diabetes   screening involves taking a blood sample to check your fasting blood sugar level. This should be done once every 3 years, after age 45, if you are within normal weight and without risk factors for diabetes. Testing should be considered at a younger age or be carried out more frequently if you are overweight and have at least 1 risk factor for diabetes.  Breast cancer screening is essential preventive care for women. You should practice "breast  self-awareness." This means understanding the normal appearance and feel of your breasts and may include breast self-examination. Any changes detected, no matter how small, should be reported to a caregiver. Women in their 20s and 30s should have a clinical breast exam (CBE) by a caregiver as part of a regular health exam every 1 to 3 years. After age 40, women should have a CBE every year. Starting at age 40, women should consider having a mammography (breast X-ray test) every year. Women who have a family history of breast cancer should talk to their caregiver about genetic screening. Women at a high risk of breast cancer should talk to their caregivers about having magnetic resonance imaging (MRI) and a mammography every year.  The Pap test is a screening test for cervical cancer. A Pap test can show cell changes on the cervix that might become cervical cancer if left untreated. A Pap test is a procedure in which cells are obtained and examined from the lower end of the uterus (cervix).  Women should have a Pap test starting at age 21.  Between ages 21 and 29, Pap tests should be repeated every 2 years.  Beginning at age 30, you should have a Pap test every 3 years as long as the past 3 Pap tests have been normal.  Some women have medical problems that increase the chance of getting cervical cancer. Talk to your caregiver about these problems. It is especially important to talk to your caregiver if a new problem develops soon after your last Pap test. In these cases, your caregiver may recommend more frequent screening and Pap tests.  The above recommendations are the same for women who have or have not gotten the vaccine for human papillomavirus (HPV).  If you had a hysterectomy for a problem that was not cancer or a condition that could lead to cancer, then you no longer need Pap tests. Even if you no longer need a Pap test, a regular exam is a good idea to make sure no other problems are  starting.  If you are between ages 65 and 70, and you have had normal Pap tests going back 10 years, you no longer need Pap tests. Even if you no longer need a Pap test, a regular exam is a good idea to make sure no other problems are starting.  If you have had past treatment for cervical cancer or a condition that could lead to cancer, you need Pap tests and screening for cancer for at least 20 years after your treatment.  If Pap tests have been discontinued, risk factors (such as a new sexual partner) need to be reassessed to determine if screening should be resumed.  The HPV test is an additional test that may be used for cervical cancer screening. The HPV test looks for the virus that can cause the cell changes on the cervix. The cells collected during the Pap test can be tested for HPV. The HPV test could be used to screen women aged 30 years and older, and should   be used in women of any age who have unclear Pap test results. After the age of 30, women should have HPV testing at the same frequency as a Pap test.  Colorectal cancer can be detected and often prevented. Most routine colorectal cancer screening begins at the age of 50 and continues through age 75. However, your caregiver may recommend screening at an earlier age if you have risk factors for colon cancer. On a yearly basis, your caregiver may provide home test kits to check for hidden blood in the stool. Use of a small camera at the end of a tube, to directly examine the colon (sigmoidoscopy or colonoscopy), can detect the earliest forms of colorectal cancer. Talk to your caregiver about this at age 50, when routine screening begins. Direct examination of the colon should be repeated every 5 to 10 years through age 75, unless early forms of pre-cancerous polyps or small growths are found.  Hepatitis C blood testing is recommended for all people born from 1945 through 1965 and any individual with known risks for hepatitis C.  Practice  safe sex. Use condoms and avoid high-risk sexual practices to reduce the spread of sexually transmitted infections (STIs). STIs include gonorrhea, chlamydia, syphilis, trichomonas, herpes, HPV, and human immunodeficiency virus (HIV). Herpes, HIV, and HPV are viral illnesses that have no cure. They can result in disability, cancer, and death. Sexually active women aged 25 and younger should be checked for chlamydia. Older women with new or multiple partners should also be tested for chlamydia. Testing for other STIs is recommended if you are sexually active and at increased risk.  Osteoporosis is a disease in which the bones lose minerals and strength with aging. This can result in serious bone fractures. The risk of osteoporosis can be identified using a bone density scan. Women ages 65 and over and women at risk for fractures or osteoporosis should discuss screening with their caregivers. Ask your caregiver whether you should take a calcium supplement or vitamin D to reduce the rate of osteoporosis.  Menopause can be associated with physical symptoms and risks. Hormone replacement therapy is available to decrease symptoms and risks. You should talk to your caregiver about whether hormone replacement therapy is right for you.  Use sunscreen with sun protection factor (SPF) of 30 or more. Apply sunscreen liberally and repeatedly throughout the day. You should seek shade when your shadow is shorter than you. Protect yourself by wearing long sleeves, pants, a wide-brimmed hat, and sunglasses year round, whenever you are outdoors.  Once a month, do a whole body skin exam, using a mirror to look at the skin on your back. Notify your caregiver of new moles, moles that have irregular borders, moles that are larger than a pencil eraser, or moles that have changed in shape or color.  Stay current with required immunizations.  Influenza. You need a dose every fall (or winter). The composition of the flu vaccine  changes each year, so being vaccinated once is not enough.  Pneumococcal polysaccharide. You need 1 to 2 doses if you smoke cigarettes or if you have certain chronic medical conditions. You need 1 dose at age 65 (or older) if you have never been vaccinated.  Tetanus, diphtheria, pertussis (Tdap, Td). Get 1 dose of Tdap vaccine if you are younger than age 65, are over 65 and have contact with an infant, are a healthcare worker, are pregnant, or simply want to be protected from whooping cough. After that, you need a Td   booster dose every 10 years. Consult your caregiver if you have not had at least 3 tetanus and diphtheria-containing shots sometime in your life or have a deep or dirty wound.  HPV. You need this vaccine if you are a woman age 26 or younger. The vaccine is given in 3 doses over 6 months.  Measles, mumps, rubella (MMR). You need at least 1 dose of MMR if you were born in 1957 or later. You may also need a second dose.  Meningococcal. If you are age 19 to 21 and a first-year college student living in a residence hall, or have one of several medical conditions, you need to get vaccinated against meningococcal disease. You may also need additional booster doses.  Zoster (shingles). If you are age 60 or older, you should get this vaccine.  Varicella (chickenpox). If you have never had chickenpox or you were vaccinated but received only 1 dose, talk to your caregiver to find out if you need this vaccine.  Hepatitis A. You need this vaccine if you have a specific risk factor for hepatitis A virus infection or you simply wish to be protected from this disease. The vaccine is usually given as 2 doses, 6 to 18 months apart.  Hepatitis B. You need this vaccine if you have a specific risk factor for hepatitis B virus infection or you simply wish to be protected from this disease. The vaccine is given in 3 doses, usually over 6 months. Preventive Services / Frequency Ages 19 to 39  Blood  pressure check.** / Every 1 to 2 years.  Lipid and cholesterol check.** / Every 5 years beginning at age 20.  Clinical breast exam.** / Every 3 years for women in their 20s and 30s.  Pap test.** / Every 2 years from ages 21 through 29. Every 3 years starting at age 30 through age 65 or 70 with a history of 3 consecutive normal Pap tests.  HPV screening.** / Every 3 years from ages 30 through ages 65 to 70 with a history of 3 consecutive normal Pap tests.  Hepatitis C blood test.** / For any individual with known risks for hepatitis C.  Skin self-exam. / Monthly.  Influenza immunization.** / Every year.  Pneumococcal polysaccharide immunization.** / 1 to 2 doses if you smoke cigarettes or if you have certain chronic medical conditions.  Tetanus, diphtheria, pertussis (Tdap, Td) immunization. / A one-time dose of Tdap vaccine. After that, you need a Td booster dose every 10 years.  HPV immunization. / 3 doses over 6 months, if you are 26 and younger.  Measles, mumps, rubella (MMR) immunization. / You need at least 1 dose of MMR if you were born in 1957 or later. You may also need a second dose.  Meningococcal immunization. / 1 dose if you are age 19 to 21 and a first-year college student living in a residence hall, or have one of several medical conditions, you need to get vaccinated against meningococcal disease. You may also need additional booster doses.  Varicella immunization.** / Consult your caregiver.  Hepatitis A immunization.** / Consult your caregiver. 2 doses, 6 to 18 months apart.  Hepatitis B immunization.** / Consult your caregiver. 3 doses usually over 6 months. Ages 40 to 64  Blood pressure check.** / Every 1 to 2 years.  Lipid and cholesterol check.** / Every 5 years beginning at age 20.  Clinical breast exam.** / Every year after age 40.  Mammogram.** / Every year beginning at age 40   and continuing for as long as you are in good health. Consult with your  caregiver.  Pap test.** / Every 3 years starting at age 30 through age 65 or 70 with a history of 3 consecutive normal Pap tests.  HPV screening.** / Every 3 years from ages 30 through ages 65 to 70 with a history of 3 consecutive normal Pap tests.  Fecal occult blood test (FOBT) of stool. / Every year beginning at age 50 and continuing until age 75. You may not need to do this test if you get a colonoscopy every 10 years.  Flexible sigmoidoscopy or colonoscopy.** / Every 5 years for a flexible sigmoidoscopy or every 10 years for a colonoscopy beginning at age 50 and continuing until age 75.  Hepatitis C blood test.** / For all people born from 1945 through 1965 and any individual with known risks for hepatitis C.  Skin self-exam. / Monthly.  Influenza immunization.** / Every year.  Pneumococcal polysaccharide immunization.** / 1 to 2 doses if you smoke cigarettes or if you have certain chronic medical conditions.  Tetanus, diphtheria, pertussis (Tdap, Td) immunization.** / A one-time dose of Tdap vaccine. After that, you need a Td booster dose every 10 years.  Measles, mumps, rubella (MMR) immunization. / You need at least 1 dose of MMR if you were born in 1957 or later. You may also need a second dose.  Varicella immunization.** / Consult your caregiver.  Meningococcal immunization.** / Consult your caregiver.  Hepatitis A immunization.** / Consult your caregiver. 2 doses, 6 to 18 months apart.  Hepatitis B immunization.** / Consult your caregiver. 3 doses, usually over 6 months. Ages 65 and over  Blood pressure check.** / Every 1 to 2 years.  Lipid and cholesterol check.** / Every 5 years beginning at age 20.  Clinical breast exam.** / Every year after age 40.  Mammogram.** / Every year beginning at age 40 and continuing for as long as you are in good health. Consult with your caregiver.  Pap test.** / Every 3 years starting at age 30 through age 65 or 70 with a 3  consecutive normal Pap tests. Testing can be stopped between 65 and 70 with 3 consecutive normal Pap tests and no abnormal Pap or HPV tests in the past 10 years.  HPV screening.** / Every 3 years from ages 30 through ages 65 or 70 with a history of 3 consecutive normal Pap tests. Testing can be stopped between 65 and 70 with 3 consecutive normal Pap tests and no abnormal Pap or HPV tests in the past 10 years.  Fecal occult blood test (FOBT) of stool. / Every year beginning at age 50 and continuing until age 75. You may not need to do this test if you get a colonoscopy every 10 years.  Flexible sigmoidoscopy or colonoscopy.** / Every 5 years for a flexible sigmoidoscopy or every 10 years for a colonoscopy beginning at age 50 and continuing until age 75.  Hepatitis C blood test.** / For all people born from 1945 through 1965 and any individual with known risks for hepatitis C.  Osteoporosis screening.** / A one-time screening for women ages 65 and over and women at risk for fractures or osteoporosis.  Skin self-exam. / Monthly.  Influenza immunization.** / Every year.  Pneumococcal polysaccharide immunization.** / 1 dose at age 65 (or older) if you have never been vaccinated.  Tetanus, diphtheria, pertussis (Tdap, Td) immunization. / A one-time dose of Tdap vaccine if you are over   65 and have contact with an infant, are a healthcare worker, or simply want to be protected from whooping cough. After that, you need a Td booster dose every 10 years.  Varicella immunization.** / Consult your caregiver.  Meningococcal immunization.** / Consult your caregiver.  Hepatitis A immunization.** / Consult your caregiver. 2 doses, 6 to 18 months apart.  Hepatitis B immunization.** / Check with your caregiver. 3 doses, usually over 6 months. ** Family history and personal history of risk and conditions may change your caregiver's recommendations. Document Released: 07/16/2001 Document Revised: 08/12/2011  Document Reviewed: 10/15/2010 [employer] Patient Information 2014 [employer].  

## 2013-05-09 DIAGNOSIS — Z23 Encounter for immunization: Secondary | ICD-10-CM | POA: Insufficient documentation

## 2013-05-09 DIAGNOSIS — Z0000 Encounter for general adult medical examination without abnormal findings: Secondary | ICD-10-CM | POA: Insufficient documentation

## 2013-05-09 NOTE — Assessment & Plan Note (Signed)
She received her Boostrix without difficulty.  She was given a handout discussing possible side effects.  

## 2013-05-09 NOTE — Assessment & Plan Note (Signed)
She received her Zostavax vaccine without difficulty.  She was given a handout discussing possible side effects.

## 2013-05-13 ENCOUNTER — Encounter: Payer: Self-pay | Admitting: *Deleted

## 2014-04-04 ENCOUNTER — Encounter: Payer: Self-pay | Admitting: Family Medicine

## 2014-11-10 ENCOUNTER — Other Ambulatory Visit: Payer: Self-pay | Admitting: *Deleted

## 2014-11-13 DIAGNOSIS — R079 Chest pain, unspecified: Secondary | ICD-10-CM | POA: Insufficient documentation

## 2014-11-14 ENCOUNTER — Ambulatory Visit (INDEPENDENT_AMBULATORY_CARE_PROVIDER_SITE_OTHER): Payer: BC Managed Care – PPO | Admitting: Interventional Cardiology

## 2014-11-14 ENCOUNTER — Encounter: Payer: Self-pay | Admitting: Interventional Cardiology

## 2014-11-14 VITALS — BP 120/72 | HR 82 | Ht 62.25 in | Wt 204.2 lb

## 2014-11-14 DIAGNOSIS — R079 Chest pain, unspecified: Secondary | ICD-10-CM

## 2014-11-14 NOTE — Patient Instructions (Signed)
Medication Instructions:  Your physician recommends that you continue on your current medications as directed. Please refer to the Current Medication list given to you today.   Labwork: NONE  Testing/Procedures: Your physician has requested that you have an exercise tolerance test. For further information please visit [email or web address]. Please also follow instruction sheet, as given.   Follow-Up: Follow up with Dr. Tamala Julian as needed.  We will schedule additional appointment if needed after your stress test.   Any Other Special Instructions Will Be Listed Below (If Applicable).

## 2014-11-14 NOTE — Progress Notes (Signed)
Cardiology Office Note   Date:  11/14/2014   ID:  Melissa Buchanan, DOB February 03, 1951, MRN [id number]  PCP:  Ival Bible, MD  Cardiologist:  Sinclair Grooms, MD   Chief Complaint  Patient presents with  . New Evaluation    chest pain      History of Present Illness: Melissa Buchanan is a 64 y.o. female who presents for an episode of chest pain occurring approximately 6-8 weeks ago. She describes the episode as chest tightness that was present when she awakened from sleep. The discomfort lasted the entire day. He was made particularly worse when she attempted to bend over to pick up a stick on she was walking her dog. There was no radiation of the discomfort. No associated dyspnea or palpitations. She slept well that evening and the next day, the discomfort had resolved. She has had no recurrence since that time. There is been no limitations in her physical activity or endurance.    Past Medical History  Diagnosis Date  . Seasonal allergies   . No pertinent past medical history   . Fibroids     H/O  . PMB (postmenopausal bleeding)     H/O    Past Surgical History  Procedure Laterality Date  . Myomectomy    . Wisdom tooth extraction    . Hysteroscopy  11/12    WITH RESECTION D & C  . Endometrial biopsy    . Bunionectomy  02/25/2012     Current Outpatient Prescriptions  Medication Sig Dispense Refill  . JUBLIA 10 % SOLN Apply 1 application topically daily.  11  . mometasone (NASONEX) 50 MCG/ACT nasal spray Place 2 sprays into the nose daily as needed. 17 g 11  . terbinafine (LAMISIL) 1 % cream Apply 1 application topically 2 (two) times daily.    Marland Kitchen terbinafine (LAMISIL) 250 MG tablet Take 250 mg by mouth daily.     No current facility-administered medications for this visit.    Allergies:   Eggs or egg-derived products    Social History:  The patient  reports that she has never smoked. She has never used smokeless tobacco. She reports that she does not drink  alcohol or use illicit drugs.   Family History:  The patient's family history includes Cancer in her brother; Heart attack in her mother; Stroke in her mother.    ROS:  Please see the history of present illness.   Otherwise, review of systems are positive for snoring and excessive daytime sleepiness..   All other systems are reviewed and negative.    PHYSICAL EXAM: VS:  BP 120/72 mmHg  Pulse 82  Ht 5' 2.25" (1.581 m)  Wt 92.625 kg (204 lb 3.2 oz)  BMI 37.06 kg/m2 , BMI Body mass index is 37.06 kg/(m^2). GEN: Well nourished, well developed, in no acute distress. Obese. HEENT: normal Neck: no JVD, carotid bruits, or masses Cardiac: RRR; no murmurs, rubs, or gallops,no edema  Respiratory:  clear to auscultation bilaterally, normal work of breathing GI: soft, nontender, nondistended, + BS MS: no deformity or atrophy Skin: warm and dry, no rash Neuro:  Strength and sensation are intact Psych: euthymic mood, full affect   EKG:  EKG is ordered today. The ekg ordered today demonstrates normal sinus rhythm with nonspecific T-wave abnormality and prominent voltage consistent with LVH   Recent Labs: No results found for requested labs within last 365 days.    Lipid Panel    Component Value Date/Time  CHOL 141 03/11/2013 0941   TRIG 56 03/11/2013 0941   HDL 53 03/11/2013 0941   CHOLHDL 2.7 03/11/2013 0941   VLDL 11 03/11/2013 0941   LDLCALC 77 03/11/2013 0941      Wt Readings from Last 3 Encounters:  11/14/14 92.625 kg (204 lb 3.2 oz)  03/31/13 89.812 kg (198 lb)  04/06/12 82.555 kg (182 lb)      Other studies Reviewed: Additional studies/ records that were reviewed today include: Reviewed records from Dr. Ival Bible Review of the above records demonstrates: The patient was seen by Dr. Rondel Baton did in March 2016 a day after the chest discomfort. She was treated for chest pain with Flexeril because there was a back component the patient has had no recurrence of symptoms  since that time. EKG performed is unchanged from today's tracing. There is prominent voltage noted   ASSESSMENT AND PLAN:  Chest pain, unspecified chest pain type - the discomfort lasted 24 hours and has resolved completely without recurrence. This favors noncardiac etiology.  ECG with prominent voltage.  Moderate obesity.       Current medicines are reviewed at length with the patient today.  The patient does not have concerns regarding medicines.  The following changes have been made:  We will plan to perform a Stress test. This will allow [place] to evaluate her blood pressure with exercise and to exclude ischemia. If there is an abnormal response, she will need to have a 2-D echocardiogram to rule out LVH and further ischemia workup may be necessary.   Labs/ tests ordered today include:   Orders Placed This Encounter  Procedures  . Exercise Tolerance Test  . EKG 12-Lead     Disposition:   FU with Hs in PRN if needed after the exercise treadmill test    Signed, Sinclair Grooms, MD  11/14/2014 5:07 PM    [facility] [place], [place],   [address] Phone: [phone]; Fax: [phone]

## 2015-01-06 ENCOUNTER — Ambulatory Visit (INDEPENDENT_AMBULATORY_CARE_PROVIDER_SITE_OTHER): Payer: BC Managed Care – PPO

## 2015-01-06 ENCOUNTER — Encounter: Payer: BC Managed Care – PPO | Admitting: Physician Assistant

## 2015-01-06 DIAGNOSIS — R079 Chest pain, unspecified: Secondary | ICD-10-CM

## 2015-01-06 LAB — EXERCISE TOLERANCE TEST
CHL CUP MPHR: 157 {beats}/min
CSEPEW: 7 METS
CSEPHR: 95 %
CSEPPHR: 150 {beats}/min
Exercise duration (min): 5 min
RPE: 16
Rest HR: 64 {beats}/min

## 2015-01-10 ENCOUNTER — Telehealth: Payer: Self-pay

## 2015-01-10 NOTE — Telephone Encounter (Signed)
Called to give pt gxt results. [email or web address]

## 2015-01-10 NOTE — Telephone Encounter (Signed)
-----   Message from Belva Crome, MD sent at 01/07/2015  5:21 PM EDT ----- Exercise test and BP response okay. Will discuss further at [facility] if she feels it is needed. I'm reassured by results.

## 2017-03-03 DIAGNOSIS — D259 Leiomyoma of uterus, unspecified: Secondary | ICD-10-CM | POA: Insufficient documentation

## 2017-03-03 DIAGNOSIS — M8580 Other specified disorders of bone density and structure, unspecified site: Secondary | ICD-10-CM | POA: Insufficient documentation

## 2017-03-03 DIAGNOSIS — N6009 Solitary cyst of unspecified breast: Secondary | ICD-10-CM | POA: Insufficient documentation

## 2017-03-03 DIAGNOSIS — N840 Polyp of corpus uteri: Secondary | ICD-10-CM | POA: Insufficient documentation

## 2017-03-03 DIAGNOSIS — N950 Postmenopausal bleeding: Secondary | ICD-10-CM | POA: Insufficient documentation

## 2017-03-11 ENCOUNTER — Emergency Department (HOSPITAL_BASED_OUTPATIENT_CLINIC_OR_DEPARTMENT_OTHER)
Admission: EM | Admit: 2017-03-11 | Discharge: 2017-03-11 | Disposition: A | Discharge status: Home / Self Care | Payer: Medicare Other | Attending: Emergency Medicine | Admitting: Emergency Medicine

## 2017-03-11 ENCOUNTER — Encounter (HOSPITAL_BASED_OUTPATIENT_CLINIC_OR_DEPARTMENT_OTHER): Payer: Self-pay | Admitting: *Deleted

## 2017-03-11 DIAGNOSIS — M5442 Lumbago with sciatica, left side: Secondary | ICD-10-CM | POA: Diagnosis not present

## 2017-03-11 DIAGNOSIS — Z79899 Other long term (current) drug therapy: Secondary | ICD-10-CM | POA: Insufficient documentation

## 2017-03-11 DIAGNOSIS — M79605 Pain in left leg: Secondary | ICD-10-CM | POA: Diagnosis present

## 2017-03-11 MED ORDER — CYCLOBENZAPRINE HCL 5 MG PO TABS
5.0000 mg | ORAL_TABLET | Freq: Once | ORAL | Status: AC
  Administered 2017-03-11: 5 mg via ORAL
  Filled 2017-03-11: qty 1

## 2017-03-11 MED ORDER — MELOXICAM 15 MG PO TABS: 15.0000 mg | ORAL_TABLET | Freq: Every day | ORAL | 0 refills | Status: AC

## 2017-03-11 MED ORDER — DEXAMETHASONE SODIUM PHOSPHATE 10 MG/ML IJ SOLN
10.0000 mg | Freq: Once | INTRAMUSCULAR | Status: AC
  Administered 2017-03-11: 10 mg via INTRAMUSCULAR
  Filled 2017-03-11: qty 1

## 2017-03-11 MED ORDER — CYCLOBENZAPRINE HCL 5 MG PO TABS: 5.0000 mg | ORAL_TABLET | Freq: Three times a day (TID) | ORAL | 0 refills | Status: DC | PRN

## 2017-03-11 NOTE — Discharge Instructions (Signed)
Take Mobic once daily with food. You may also take [phone] mg of Tylenol every 6 hours for pain. You may take Flexeril for muscle spasms, I typically recommend taking this at night only as it may make you drowsy. Do not drive, drink alcohol, or operate heavy machinery on this medication. Apply ice or heat for comfort. I have attached exercises for the low back that you may do at home. Follow-up with your primary care physician as scheduled tomorrow. They may give you a prescription for physical therapy. Return to the ED if any concerning signs or symptoms develop such as weakness, fevers, loss of control of bowel or bladder, or inability to walk.

## 2017-03-11 NOTE — ED Triage Notes (Addendum)
Lower back pain 2 weeks with radiation down her left leg with llower leg numbness, and pain in her entire leg. She was started on Baclofen and Naproxen with no relief.

## 2017-03-11 NOTE — ED Notes (Signed)
Pt verbalizes understanding of d/c instructions and denies any further needs at this time. 

## 2017-03-11 NOTE — ED Notes (Signed)
Pt. Reports pain in the L side back and L buttock that runs down the L back side of her leg and into the L front side of her leg causing numbness in the L front leg.  Normal skin temp and Pt. In no distress.

## 2017-03-11 NOTE — ED Provider Notes (Signed)
[facility] Provider Note   CSN: [id number] Arrival date & time: 03/11/17  2003     History   Chief Complaint Chief Complaint  Patient presents with  . Leg Pain    HPI Melissa Buchanan is a 66 y.o. female with history of uterine fibroids and seasonal allergies who presents today with chief complaint acute onset, progressively worsening left-sided low back pain and left leg pain. She states that pain is constant, sharp, worsened with ambulation bending, and prolonged movements. Pain radiates down the posterior aspect of her left lower extremity, and she endorses numbness to the anterior shin from the knee to the ankle. She denies bowel or bladder incontinence, saddle anesthesia, fevers, chills, history of cancer, or IV drug use. 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 She states this was not significantly helpful.applying heat to the affected extremity has been helpful. She denies CP, SOB, headaches, dental pain, nausea, vomiting.she denies any trauma or falls.  The history is provided by the patient.    Past Medical History:  Diagnosis Date  . Fibroids    H/O  . No pertinent past medical history   . PMB (postmenopausal bleeding)    H/O  . Seasonal allergies     Patient Active Problem List   Diagnosis Date Noted  . Chest pain 11/13/2014  . Routine general medical examination at a health care facility 05/09/2013  . Need for prophylactic vaccination with combined diphtheria-tetanus-pertussis (DTP) vaccine 05/09/2013  . Need for prophylactic vaccination and inoculation against other viral diseases(V04.89) 05/09/2013    Past Surgical History:  Procedure Laterality Date  . BUNIONECTOMY  02/25/2012  . ENDOMETRIAL BIOPSY    . HYSTEROSCOPY  11/12   WITH RESECTION D & C  . MYOMECTOMY    . WISDOM TOOTH EXTRACTION      OB History      Gravida Para Term Preterm AB Living   1         1   SAB TAB Ectopic Multiple Live Births                   Home Medications    Prior to Admission medications   Medication Sig Start Date End Date Taking? Authorizing Provider  cyclobenzaprine (FLEXERIL) 5 MG tablet Take 1 tablet (5 mg total) by mouth 3 (three) times daily as needed for muscle spasms. 03/11/17   Audrina Marten A, PA-C  JUBLIA 10 % SOLN Apply 1 application topically daily. 10/25/14   [provider]  meloxicam (MOBIC) 15 MG tablet Take 1 tablet (15 mg total) by mouth daily. 03/11/17 04/10/17  Longino Trefz A, PA-C  mometasone (NASONEX) 50 MCG/ACT nasal spray Place 2 sprays into the nose daily as needed. 03/31/13 11/14/14  Jonathon Resides, MD  terbinafine (LAMISIL) 1 % cream Apply 1 application topically 2 (two) times daily.    [provider]  terbinafine (LAMISIL) 250 MG tablet Take 250 mg by mouth daily. 10/25/14   [provider]    Family History Family History  Problem Relation Age of Onset  . Stroke Mother   . Heart attack Mother   . Cancer Brother        MELANOMA    Social History Social History  Substance Use Topics  . Smoking status: Never Smoker  . Smokeless tobacco: Never Used  . Alcohol use No  Allergies   Eggs or egg-derived products   Review of Systems Review of Systems  Constitutional: Negative for chills and fever.  Respiratory: Negative for shortness of breath.   Cardiovascular: Negative for chest pain.  Gastrointestinal: Negative for diarrhea, nausea and vomiting.  Musculoskeletal: Positive for arthralgias, back pain and myalgias. Negative for neck pain.  Neurological: Positive for numbness. Negative for weakness.  All other systems reviewed and are negative.    Physical Exam Updated Vital Signs BP 139/80   Pulse 85   Temp 98 F (36.7 C) (Oral)   Resp 14   Ht 5\' 3"  (1.6 m)   Wt 77.1 kg (170 lb)   SpO2 99%   BMI 30.11 kg/m   Physical Exam   Constitutional: She is oriented to person, place, and time. She appears well-developed and well-nourished. No distress.  HENT:  Head: Normocephalic and atraumatic.  Eyes: Conjunctivae are normal. Right eye exhibits no discharge. Left eye exhibits no discharge.  Neck: Normal range of motion. Neck supple. No JVD present. No tracheal deviation present.  No midline spine TTP, no paraspinal muscle tenderness, no deformity, crepitus, or step-off noted   Cardiovascular: Normal rate, regular rhythm, normal heart sounds and intact distal pulses.   2+ radial and DP/PT pulses bl, negative Homan's bl   Pulmonary/Chest: Effort normal and breath sounds normal.  Abdominal: Soft. Bowel sounds are normal. She exhibits no distension. There is no tenderness.  Musculoskeletal: She exhibits tenderness. She exhibits no edema.  No midline spine TTP, left lumbar paraspinal muscle tenderness at around the level of L5/S1. Left SI joint tenderness present. Left sciatic notch tenderness present. No deformity, crepitus, or step-off noted. 5/5 strength of BLE major muscle groups. Left straight leg raise noted. Mildly antalgic gait, but able to heel walk and toe walk without difficulty.  Neurological: She is alert and oriented to person, place, and time. A sensory deficit is present. She exhibits normal muscle tone.  Skin: Skin is warm and dry. No erythema.  Fluent speech, no facial droop, altered sensation to soft touch of the left shin from the knee to the ankle. Otherwise sensation intact to soft touch of lower extremities. Able to heel walk and toe walk without difficulty, good muscle tone  Psychiatric: She has a normal mood and affect. Her behavior is normal.  Nursing note and vitals reviewed.    ED Treatments / Results  Labs (all labs ordered are listed, but only abnormal results are displayed) Labs Reviewed - No data to display  EKG  EKG Interpretation None       Radiology No results  found.  Procedures Procedures (including critical care time)  Medications Ordered in ED Medications  dexamethasone (DECADRON) injection 10 mg (10 mg Intramuscular Given 03/11/17 2114)  cyclobenzaprine (FLEXERIL) tablet 5 mg (5 mg Oral Given 03/11/17 2114)     Initial Impression / Assessment and Plan / ED Course  I have reviewed the triage vital signs and the nursing notes.  Pertinent labs & imaging results that were available during my care of the patient were reviewed by me and considered in my medical decision making (see chart for details).     Patient with left-sided back pain and sciatica. Afebrile, vital signs are stable. No red flags concerning patient's back pain. No s/s of central cord compression or cauda equina. Low suspicion AAA in a hemodynamically stable pt with no pulsatile mass. Patient is ambulating without difficulty. She has follow-up with primary care physician scheduled for tomorrow. She was  given Decadron injection while in the ED as well as Flexeril. I suspect disc herniation contributing to her symptoms. She is stable for discharge home with Modic and Flexeril and I encouraged her to keep her appointment with PCP. Discussed indications for return to the ED. Pt and pt's husband verbalized understanding of and agreement with plan and pt is safe for discharge home at this time.  Final Clinical Impressions(s) / ED Diagnoses   Final diagnoses:  Acute left-sided low back pain with left-sided sciatica    New Prescriptions Discharge Medication List as of 03/11/2017  9:19 PM    START taking these medications   Details  cyclobenzaprine (FLEXERIL) 5 MG tablet Take 1 tablet (5 mg total) by mouth 3 (three) times daily as needed for muscle spasms., Starting Tue 03/11/2017, Print    meloxicam (MOBIC) 15 MG tablet Take 1 tablet (15 mg total) by mouth daily., Starting Tue 03/11/2017, Until Thu 04/10/2017, Print         Nils Flack, [place], PA-C 03/12/17 6004    Rex Kras Wenda Overland, MD 03/12/17 1322

## 2017-06-10 NOTE — Progress Notes (Signed)
[name] [address] [place], [place] [address] Phone: [phone] Subjective:    I'm seeing this patient by the request  of:  Zanard, Bernadene Bell, MD   CC: Low back pain with numbness and tingling in the leg.  [id number]  Melissa Buchanan is a 67 y.o. female coming in with complaint of low back pain. She states that she has numbness in her leg (anterior). She did have back pain at the time. She says that the numbness moves posterior to her calf as well.  Onset- October Location- low back left side Duration- All day  Character-dull, throbbing aching Aggravating factors-  Reliving factors-  Therapies tried-  Severity-6 out of 10   Patient did have x-rays taken October 2018.  This is of the lumbar spine.  Found to have moderate arthritic changes including degenerative disc disease and facet osteoarthritic changes at L4 through S1 bilaterally.  Past Medical History:  Diagnosis Date  . Fibroids    H/O  . No pertinent past medical history   . PMB (postmenopausal bleeding)    H/O  . Seasonal allergies    Past Surgical History:  Procedure Laterality Date  . BUNIONECTOMY  02/25/2012  . ENDOMETRIAL BIOPSY    . HYSTEROSCOPY  11/12   WITH RESECTION D & C  . MYOMECTOMY    . WISDOM TOOTH EXTRACTION     Social History   Socioeconomic History  . Marital status: Married    Spouse name: Not on file  . Number of children: Not on file  . Years of education: Not on file  . Highest education level: Not on file  Social Needs  . Financial resource strain: Not on file  . Food insecurity - worry: Not on file  . Food insecurity - inability: Not on file  . Transportation needs - medical: Not on file  . Transportation needs - non-medical: Not on file  Occupational History  . Not on file  Tobacco Use  . Smoking status: Never Smoker  . Smokeless tobacco: Never Used  Substance and Sexual Activity  . Alcohol use: No  . Drug use: No  . Sexual activity:  Yes    Birth control/protection: Post-menopausal  Other Topics Concern  . Not on file  Social History Narrative   Marital Status: Married [name])   Children:  Daughter [name])    Pets: None    Living Situation: Lives with her husband.     Occupation:  Retired     Education: Forensic psychologist   Tobacco Use/Exposure:  None    Alcohol Use:  Occasional   Drug Use:  None   Diet:  Regular   Exercise:  None   Hobbies: Bible Study   Allergies  Allergen Reactions  . Eggs Or Egg-Derived Products Diarrhea and Nausea And Vomiting   Family History  Problem Relation Age of Onset  . Stroke Mother   . Heart attack Mother   . Cancer Brother        MELANOMA     Past medical history, social, surgical and family history all reviewed in electronic medical record.  No pertanent information unless stated regarding to the chief complaint.   Review of Systems:Review of systems updated and as accurate as of 06/10/17  No headache, visual changes, nausea, vomiting, diarrhea, constipation, dizziness, abdominal pain, skin rash, fevers, chills, night sweats, weight loss, swollen lymph nodes, body aches, joint swelling, muscle aches, chest pain, shortness of breath, mood changes.  Objective  There were no vitals taken for this visit. Systems examined below as of 06/10/17   General: No apparent distress alert and oriented x3 mood and affect normal, dressed appropriately.  HEENT: Pupils equal, extraocular movements intact  Respiratory: Patient's speak in full sentences and does not appear short of breath  Cardiovascular: No lower extremity edema, non tender, no erythema  Skin: Warm dry intact with no signs of infection or rash on extremities or on axial skeleton.  Abdomen: Soft nontender  Neuro: Cranial nerves II through XII are intact, neurovascularly intact in all extremities with 2+ DTRs and 2+ pulses.  Lymph: No lymphadenopathy of posterior or anterior cervical chain or axillae bilaterally.  Gait  normal with good balance and coordination.  MSK:  Non tender with full range of motion and good stability and symmetric strength and tone of shoulders, elbows, wrist, hip, knee and ankles bilaterally.  Patient does have mild pes planus bilaterally.  Patient still has tenderness over the anterior medial aspect of the tibia.  Callus formation noted.  Mild positive Tinel's in the area.  Full strength of the lower extremity.  Negative straight leg test.  Negative Faber test.  No pain to the back.    Impression and Recommendations:     This case required medical decision making of moderate complexity.      Note: This dictation was prepared with Dragon dictation along with smaller phrase technology. Any transcriptional errors that result from this process are unintentional.

## 2017-06-11 ENCOUNTER — Encounter: Payer: Self-pay | Admitting: Family Medicine

## 2017-06-11 ENCOUNTER — Ambulatory Visit: Payer: Medicare Other | Admitting: Family Medicine

## 2017-06-11 DIAGNOSIS — M84369A Stress fracture, unspecified tibia and fibula, initial encounter for fracture: Secondary | ICD-10-CM | POA: Diagnosis not present

## 2017-06-11 MED ORDER — VITAMIN D (ERGOCALCIFEROL) 1.25 MG (50000 UNIT) PO CAPS: 50000.0000 [IU] | ORAL_CAPSULE | ORAL | 0 refills | Status: DC

## 2017-06-11 NOTE — Patient Instructions (Addendum)
Good to see you  I think you have a slow healing stress fracture.  Ice 20 minutes 2 times daily. Usually after activity and before bed. Exercises 3 times a week.  Always wear good shoes but do not lace the last eye on the shoes.  Once weekly vitamin D for 12 weeks  Over the counter get  B12 1095mcg daily and B6 200mg  daily  Compression socks with working out [employer], elliptical or swimming for next 3 weeks See me again in 4 weeks [place]!

## 2017-06-11 NOTE — Assessment & Plan Note (Signed)
I believe the patient does have a stress reaction of the lower extremity.  We discussed with patient about proper shoes, vitamin D supplementation, icing regimen compression and home exercises.  Patient will try this and follow-up in 4 weeks.  At follow-up ultrasound to ensure good callus formation noted.

## 2017-07-09 ENCOUNTER — Ambulatory Visit: Payer: Medicare Other | Admitting: Family Medicine

## 2017-07-09 ENCOUNTER — Ambulatory Visit: Payer: Self-pay

## 2017-07-09 ENCOUNTER — Encounter: Payer: Self-pay | Admitting: Family Medicine

## 2017-07-09 VITALS — BP 132/76 | HR 86 | Ht 63.0 in | Wt 169.0 lb

## 2017-07-09 DIAGNOSIS — M79605 Pain in left leg: Secondary | ICD-10-CM

## 2017-07-09 DIAGNOSIS — M84369D Stress fracture, unspecified tibia and fibula, subsequent encounter for fracture with routine healing: Secondary | ICD-10-CM

## 2017-07-09 NOTE — Progress Notes (Signed)
[name] [place], [place] [address] Phone: [phone] Subjective:    CC: Leg pain follow-up  [id number]  Melissa Buchanan is a 67 y.o. female coming in with complaint of found to have a stress fracture of the lower extremity.  Patient was given vitamin D, continue compression, avoid high impact exercises.  Patient states she has been using compression, taking Vitamin D,B6 and B12. Denies swelling. Pain has improved, denies pain ambulating.  Would state that she is better than 65%.      Past Medical History:  Diagnosis Date  . Chicken pox    67 years old  . Fibroids    H/O  . Migraines    67 years old  . No pertinent past medical history   . PMB (postmenopausal bleeding)    H/O  . Seasonal allergies    Past Surgical History:  Procedure Laterality Date  . BUNIONECTOMY  02/25/2012  . ENDOMETRIAL BIOPSY    . HYSTEROSCOPY  11/12   WITH RESECTION D & C  . MYOMECTOMY    . WISDOM TOOTH EXTRACTION     Social History   Socioeconomic History  . Marital status: Married    Spouse name: None  . Number of children: None  . Years of education: None  . Highest education level: None  Social Needs  . Financial resource strain: None  . Food insecurity - worry: None  . Food insecurity - inability: None  . Transportation needs - medical: None  . Transportation needs - non-medical: None  Occupational History  . None  Tobacco Use  . Smoking status: Never Smoker  . Smokeless tobacco: Never Used  Substance and Sexual Activity  . Alcohol use: No  . Drug use: No  . Sexual activity: No    Birth control/protection: Post-menopausal  Other Topics Concern  . None  Social History Narrative   Marital Status: Married [name])   Children:  Daughter [name])    Pets: None    Living Situation: Lives with her husband.     Occupation:  Retired     Education: Forensic psychologist   Tobacco Use/Exposure:  None    Alcohol Use:  Occasional   Drug  Use:  None   Diet:  Regular   Exercise:  None   Hobbies: Bible Study   Allergies  Allergen Reactions  . Eggs Or Egg-Derived Products Diarrhea and Nausea And Vomiting   Family History  Problem Relation Age of Onset  . Stroke Mother   . Heart attack Mother   . Arthritis Mother   . Cancer Mother   . Heart disease Mother   . Hypertension Mother   . Hyperlipidemia Mother   . Cancer Brother        MELANOMA  . Cancer Father      Past medical history, social, surgical and family history all reviewed in electronic medical record.  No pertanent information unless stated regarding to the chief complaint.   Review of Systems:Review of systems updated and as accurate as of 07/09/17  No headache, visual changes, nausea, vomiting, diarrhea, constipation, dizziness, abdominal pain, skin rash, fevers, chills, night sweats, weight loss, swollen lymph nodes, body aches, joint swelling, chest pain, shortness of breath, mood changes.  Positive muscle aches  Objective  Blood pressure 132/76, pulse 86, height 5\' 3"  (1.6 m), weight 169 lb (76.7 kg), SpO2 98 %. Systems examined below as of 07/09/17   General: No apparent distress alert and oriented  x3 mood and affect normal, dressed appropriately.  HEENT: Pupils equal, extraocular movements intact  Respiratory: Patient's speak in full sentences and does not appear short of breath  Cardiovascular: No lower extremity edema, non tender, no erythema  Skin: Warm dry intact with no signs of infection or rash on extremities or on axial skeleton.  Abdomen: Soft nontender  Neuro: Cranial nerves II through XII are intact, neurovascularly intact in all extremities with 2+ DTRs and 2+ pulses.  Lymph: No lymphadenopathy of posterior or anterior cervical chain or axillae bilaterally.  Gait normal with good balance and coordination.  MSK:  Non tender with full range of motion and good stability and symmetric strength and tone of shoulders, elbows, wrist, hip, knee  and ankles bilaterally.  Patient still has very minimal tenderness on the medial aspect.  Seem better no swelling. Limited musculoskeletal ultrasound was performed and interpreted by Lyndal Pulley  Limited ultrasound of patient's lower extremity showing callus formation noted with significant decrease in hypoechoic changes were patient is the most tender.  Mild increase in Doppler. Impression: Healing interval of the tibial stress fracture    Impression and Recommendations:     This case required medical decision making of moderate complexity.      Note: This dictation was prepared with Dragon dictation along with smaller phrase technology. Any transcriptional errors that result from this process are unintentional.

## 2017-07-09 NOTE — Assessment & Plan Note (Signed)
Discussed continued compression, once weekly vitamin D, icing regimen.  Which activities to doing which wants to avoid.  Night and running regimen given.  Follow-up again in 6 weeks

## 2017-07-09 NOTE — Patient Instructions (Signed)
Good to see you  Melissa Buchanan is your friend. Only as needed Continue the vitamin D once a week for another 6-8 weeks I like how well you are doing No waling for another 2 weeks See me again in 6 weeks if not perfect

## 2017-08-19 NOTE — Progress Notes (Signed)
[name] [address], [place] [address] Phone: [phone] Subjective:    I'm seeing this patient by the request  of:    CC: Leg pain follow-up  FOY:[id number]  Melissa Buchanan is a 67 y.o. female coming in with complaint of left leg pain. She said that when she has compression on the leg that her leg does not bother her.  Patient had with some numbness in an area as well as a callus formation that was likely more of a medial tibial stress syndrome.  Patient states that she only feels some type of pressure but nothing else.  No pain.  Has not been walking well.  Doing the exercises intermittently.      Past Medical History:  Diagnosis Date  . Chicken pox    67 years old  . Fibroids    H/O  . Migraines    67 years old  . No pertinent past medical history   . PMB (postmenopausal bleeding)    H/O  . Seasonal allergies    Past Surgical History:  Procedure Laterality Date  . BUNIONECTOMY  02/25/2012  . ENDOMETRIAL BIOPSY    . HYSTEROSCOPY  11/12   WITH RESECTION D & C  . MYOMECTOMY    . WISDOM TOOTH EXTRACTION     Social History   Socioeconomic History  . Marital status: Married    Spouse name: Not on file  . Number of children: Not on file  . Years of education: Not on file  . Highest education level: Not on file  Social Needs  . Financial resource strain: Not on file  . Food insecurity - worry: Not on file  . Food insecurity - inability: Not on file  . Transportation needs - medical: Not on file  . Transportation needs - non-medical: Not on file  Occupational History  . Not on file  Tobacco Use  . Smoking status: Never Smoker  . Smokeless tobacco: Never Used  Substance and Sexual Activity  . Alcohol use: No  . Drug use: No  . Sexual activity: No    Birth control/protection: Post-menopausal  Other Topics Concern  . Not on file  Social History Narrative   Marital Status: Married [name])   Children:  Daughter [name])     Pets: None    Living Situation: Lives with her husband.     Occupation:  Retired     Education: Forensic psychologist   Tobacco Use/Exposure:  None    Alcohol Use:  Occasional   Drug Use:  None   Diet:  Regular   Exercise:  None   Hobbies: Bible Study   Allergies  Allergen Reactions  . Eggs Or Egg-Derived Products Diarrhea and Nausea And Vomiting   Family History  Problem Relation Age of Onset  . Stroke Mother   . Heart attack Mother   . Arthritis Mother   . Cancer Mother   . Heart disease Mother   . Hypertension Mother   . Hyperlipidemia Mother   . Cancer Brother        MELANOMA  . Cancer Father      Past medical history, social, surgical and family history all reviewed in electronic medical record.  No pertanent information unless stated regarding to the chief complaint.   Review of Systems:Review of systems updated and as accurate as of 08/20/17  No headache, visual changes, nausea, vomiting, diarrhea, constipation, dizziness, abdominal pain, skin rash, fevers, chills, night sweats, weight  loss, swollen lymph nodes, body aches, joint swelling, muscle aches, chest pain, shortness of breath, mood changes.  Positive muscle aches  Objective  Blood pressure 110/64, pulse 64, height 5\' 3"  (1.6 m), weight 171 lb (77.6 kg), SpO2 97 %. Systems examined below as of 08/20/17   General: No apparent distress alert and oriented x3 mood and affect normal, dressed appropriately.  HEENT: Pupils equal, extraocular movements intact  Respiratory: Patient's speak in full sentences and does not appear short of breath  Cardiovascular: No lower extremity edema, non tender, no erythema  Skin: Warm dry intact with no signs of infection or rash on extremities or on axial skeleton.  Abdomen: Soft nontender  Neuro: Cranial nerves II through XII are intact, neurovascularly intact in all extremities with 2+ DTRs and 2+ pulses.  Lymph: No lymphadenopathy of posterior or anterior cervical chain or  axillae bilaterally.  Gait normal with good balance and coordination.  MSK:  Non tender with full range of motion and good stability and symmetric strength and tone of shoulders, elbows, wrist, hip, knee and ankles bilaterally.  Patient has no tenderness over the leg at this time.  No pain to palpation or percussion.  Full range of motion of the ankle and the knee.  Negative straight leg test.    Impression and Recommendations:     This case required medical decision making of moderate complexity.      Note: This dictation was prepared with Dragon dictation along with smaller phrase technology. Any transcriptional errors that result from this process are unintentional.

## 2017-08-20 ENCOUNTER — Encounter: Payer: Self-pay | Admitting: Family Medicine

## 2017-08-20 ENCOUNTER — Ambulatory Visit: Payer: Medicare Other | Admitting: Family Medicine

## 2017-08-20 DIAGNOSIS — M84369D Stress fracture, unspecified tibia and fibula, subsequent encounter for fracture with routine healing: Secondary | ICD-10-CM

## 2017-08-20 NOTE — Patient Instructions (Signed)
Good to see you  You will do great  I would walk 3 times a week  Body pump or weights 2-3 times a week  Continue vitamin D  I would wear compression socks with walking and ice 10-20 minutes after walking and maybe working out.  Consider a trainer for guidance See me again in 2 months if not perfect

## 2017-08-20 NOTE — Assessment & Plan Note (Signed)
I believe the patient is completely resolved with pain at this time.  Continue the vitamin D, compression with walking, increase activity slowly.  We discussed.  Sedation with working out.  Discussed icing regimen and home exercises.  Discussed which activities to do which wants to avoid.  Follow-up again in 8 weeks

## 2017-10-20 DIAGNOSIS — R21 Rash and other nonspecific skin eruption: Secondary | ICD-10-CM | POA: Insufficient documentation

## 2017-10-20 DIAGNOSIS — R200 Anesthesia of skin: Secondary | ICD-10-CM | POA: Insufficient documentation

## 2017-10-20 DIAGNOSIS — L739 Follicular disorder, unspecified: Secondary | ICD-10-CM | POA: Insufficient documentation

## 2017-10-22 ENCOUNTER — Other Ambulatory Visit (INDEPENDENT_AMBULATORY_CARE_PROVIDER_SITE_OTHER): Payer: Medicare Other

## 2017-10-22 ENCOUNTER — Ambulatory Visit: Payer: Medicare Other | Admitting: Family Medicine

## 2017-10-22 ENCOUNTER — Encounter: Payer: Self-pay | Admitting: Family Medicine

## 2017-10-22 VITALS — BP 102/62 | HR 52 | Ht 63.0 in | Wt 168.0 lb

## 2017-10-22 DIAGNOSIS — M2550 Pain in unspecified joint: Secondary | ICD-10-CM

## 2017-10-22 DIAGNOSIS — M84369D Stress fracture, unspecified tibia and fibula, subsequent encounter for fracture with routine healing: Secondary | ICD-10-CM

## 2017-10-22 LAB — VITAMIN B12: Vitamin B-12: 285 pg/mL (ref 211–911)

## 2017-10-22 LAB — VITAMIN D 25 HYDROXY (VIT D DEFICIENCY, FRACTURES): VITD: 37.71 ng/mL (ref 30.00–100.00)

## 2017-10-22 NOTE — Assessment & Plan Note (Signed)
Patient is doing remarkably well at this time.  Will check B12 due to the complaint is still some numbness in the vicinity.  We also discussed low vitamin D and depending on the laboratory results of that we will decide if we need to continue the once weekly or go to over-the-counter daily. This patient does well she can follow-up as needed

## 2017-10-22 NOTE — Progress Notes (Signed)
[name] [name], [name] [address] Phone: [phone] Subjective:      CC: Leg pain follow-up  CWC:[id number]  Melissa Buchanan is a 67 y.o. female coming in with complaint of leg pain.  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  Did have good callus formation at last follow-up.  Was to increase activity as tolerated.  Patient states that she has continued numbness on the medial aspect of her leg. She is wondering why this hasn't gone away.  Patient is able to do more activities.  Walking every day even though she was told to do it every other day.     Past Medical History:  Diagnosis Date  . Chicken pox    67 years old  . Fibroids    H/O  . Migraines    67 years old  . No pertinent past medical history   . PMB (postmenopausal bleeding)    H/O  . Seasonal allergies    Past Surgical History:  Procedure Laterality Date  . BUNIONECTOMY  02/25/2012  . ENDOMETRIAL BIOPSY    . HYSTEROSCOPY  11/12   WITH RESECTION D & C  . MYOMECTOMY    . WISDOM TOOTH EXTRACTION     Social History   Socioeconomic History  . Marital status: Married    Spouse name: Not on file  . Number of children: Not on file  . Years of education: Not on file  . Highest education level: Not on file  Occupational History  . Not on file  Social Needs  . Financial resource strain: Not on file  . Food insecurity:    Worry: Not on file    Inability: Not on file  . Transportation needs:    Medical: Not on file    Non-medical: Not on file  Tobacco Use  . Smoking status: Never Smoker  . Smokeless tobacco: Never Used  Substance and Sexual Activity  . Alcohol use: No  . Drug use: No  . Sexual activity: Never    Birth control/protection: Post-menopausal  Lifestyle  . Physical activity:    Days per week: Not on file    Minutes per session: Not on file  . Stress: Not on file  Relationships  . Social connections:    Talks on phone: Not on  file    Gets together: Not on file    Attends religious service: Not on file    Active member of club or organization: Not on file    Attends meetings of clubs or organizations: Not on file    Relationship status: Not on file  Other Topics Concern  . Not on file  Social History Narrative   Marital Status: Married [name])   Children:  Daughter [name])    Pets: None    Living Situation: Lives with her husband.     Occupation:  Retired     Education: Forensic psychologist   Tobacco Use/Exposure:  None    Alcohol Use:  Occasional   Drug Use:  None   Diet:  Regular   Exercise:  None   Hobbies: Bible Study   Allergies  Allergen Reactions  . Eggs Or Egg-Derived Products Diarrhea and Nausea And Vomiting   Family History  Problem Relation Age of Onset  . Stroke Mother   . Heart attack Mother   . Arthritis Mother   . Cancer Mother   . Heart disease Mother   . Hypertension  Mother   . Hyperlipidemia Mother   . Cancer Brother        MELANOMA  . Cancer Father      Past medical history, social, surgical and family history all reviewed in electronic medical record.  No pertanent information unless stated regarding to the chief complaint.   Review of Systems:Review of systems updated and as accurate as of 10/22/17  No headache, visual changes, nausea, vomiting, diarrhea, constipation, dizziness, abdominal pain, skin rash, fevers, chills, night sweats, weight loss, swollen lymph nodes, body aches, joint swelling, muscle aches, chest pain, shortness of breath, mood changes.   Objective  Blood pressure 102/62, pulse (!) 52, height 5\' 3"  (1.6 m), weight 168 lb (76.2 kg), SpO2 92 %. Systems examined below as of 10/22/17   General: No apparent distress alert and oriented x3 mood and affect normal, dressed appropriately.  HEENT: Pupils equal, extraocular movements intact  Respiratory: Patient's speak in full sentences and does not appear short of breath  Cardiovascular: No lower extremity  edema, non tender, no erythema  Skin: Warm dry intact with no signs of infection or rash on extremities or on axial skeleton.  Abdomen: Soft nontender  Neuro: Cranial nerves II through XII are intact, neurovascularly intact in all extremities with 2+ DTRs and 2+ pulses.  Lymph: No lymphadenopathy of posterior or anterior cervical chain or axillae bilaterally.  Gait normal with good balance and coordination.  MSK:  Non tender with full range of motion and good stability and symmetric strength and tone of shoulders, elbows, wrist, hip, knee and ankles bilaterally.  Left knee lower extremity shows no significant discomfort to palpation.  No swelling noted.  Full range of motion of the knee noted.  No crepitus.  Neurovascularly intact distally.  No pain over the medial tibial area.  Negative pain to compression or Tinel's    Impression and Recommendations:     This case required medical decision making of moderate complexity.      Note: This dictation was prepared with Dragon dictation along with smaller phrase technology. Any transcriptional errors that result from this process are unintentional.

## 2017-10-22 NOTE — Patient Instructions (Addendum)
Good to see you  Get labs downstairs today  ICe when you need it You are doing great  Increase walking only .94miles a week.  Drink 2 cups of water in the morning when you wake up  1 cup of water before each meal.  Make sure you eat something every 2 hours through the day  No carbs after 6 pm  See me again when you need me

## 2017-10-23 ENCOUNTER — Encounter: Payer: Self-pay | Admitting: Family Medicine

## 2017-11-16 DIAGNOSIS — Z780 Asymptomatic menopausal state: Secondary | ICD-10-CM | POA: Insufficient documentation

## 2018-12-21 ENCOUNTER — Other Ambulatory Visit: Payer: Self-pay

## 2018-12-21 ENCOUNTER — Encounter (HOSPITAL_BASED_OUTPATIENT_CLINIC_OR_DEPARTMENT_OTHER): Payer: Self-pay | Admitting: Emergency Medicine

## 2018-12-21 ENCOUNTER — Emergency Department (HOSPITAL_BASED_OUTPATIENT_CLINIC_OR_DEPARTMENT_OTHER)
Admission: EM | Admit: 2018-12-21 | Discharge: 2018-12-21 | Disposition: A | Discharge status: Home / Self Care | Payer: Medicare Other | Attending: Emergency Medicine | Admitting: Emergency Medicine

## 2018-12-21 DIAGNOSIS — H1131 Conjunctival hemorrhage, right eye: Secondary | ICD-10-CM | POA: Insufficient documentation

## 2018-12-21 DIAGNOSIS — Z79899 Other long term (current) drug therapy: Secondary | ICD-10-CM | POA: Insufficient documentation

## 2018-12-21 DIAGNOSIS — H11431 Conjunctival hyperemia, right eye: Secondary | ICD-10-CM | POA: Diagnosis present

## 2018-12-21 MED ORDER — FLUORESCEIN SODIUM 1 MG OP STRP
1.0000 | ORAL_STRIP | Freq: Once | OPHTHALMIC | Status: AC
  Administered 2018-12-21: 1 via OPHTHALMIC
  Filled 2018-12-21: qty 1

## 2018-12-21 MED ORDER — TETRACAINE HCL 0.5 % OP SOLN
2.0000 [drp] | Freq: Once | OPHTHALMIC | Status: AC
  Administered 2018-12-21: 2 [drp] via OPHTHALMIC
  Filled 2018-12-21: qty 4

## 2018-12-21 NOTE — ED Provider Notes (Signed)
[facility] EMERGENCY DEPARTMENT Provider Note   CSN: [id number] Arrival date & time: 12/21/18  8250    History   Chief Complaint Chief Complaint  Patient presents with  . Eye Problem    HPI Melissa Buchanan is a 68 y.o. female.     HPI Patient presents to the emergency room for evaluation of a scratched eye.  Patient was playing with her dog this morning when she was accidentally scratched in her eye.  Patient denies any trouble with her vision since then but has noticed redness the bottom of her eye.  She has had some mild increased tearing but no drainage.  She denies any injuries.  No other complaints. Past Medical History:  Diagnosis Date  . Chicken pox    68 years old  . Fibroids    H/O  . Migraines    68 years old  . No pertinent past medical history   . PMB (postmenopausal bleeding)    H/O  . Seasonal allergies     Patient Active Problem List   Diagnosis Date Noted  . Stress fracture of lower leg 06/11/2017  . Chest pain 11/13/2014  . Routine general medical examination at a health care facility 05/09/2013  . Need for prophylactic vaccination with combined diphtheria-tetanus-pertussis (DTP) vaccine 05/09/2013  . Need for prophylactic vaccination and inoculation against other viral diseases(V04.89) 05/09/2013    Past Surgical History:  Procedure Laterality Date  . BUNIONECTOMY  02/25/2012  . ENDOMETRIAL BIOPSY    . HYSTEROSCOPY  11/12   WITH RESECTION D & C  . MYOMECTOMY    . WISDOM TOOTH EXTRACTION       OB History    Gravida  1   Para      Term      Preterm      AB      Living  1     SAB      TAB      Ectopic      Multiple      Live Births               Home Medications    Prior to Admission medications   Medication Sig Start Date End Date Taking? Authorizing Provider  vitamin B-12 (CYANOCOBALAMIN) 1000 MCG tablet Take 1,000 mcg by mouth daily.    [provider]  Vitamin D, Ergocalciferol, (DRISDOL)  50000 units CAPS capsule Take 1 capsule (50,000 Units total) by mouth every 7 (seven) days. 06/11/17   Lyndal Pulley, DO    Family History Family History  Problem Relation Age of Onset  . Stroke Mother   . Heart attack Mother   . Arthritis Mother   . Cancer Mother   . Heart disease Mother   . Hypertension Mother   . Hyperlipidemia Mother   . Cancer Brother        MELANOMA  . Cancer Father     Social History Social History   Tobacco Use  . Smoking status: Never Smoker  . Smokeless tobacco: Never Used  Substance Use Topics  . Alcohol use: No  . Drug use: No     Allergies   Eggs or egg-derived products   Review of Systems Review of Systems  All other systems reviewed and are negative.    Physical Exam Updated Vital Signs BP (!) 146/78   Pulse 76   Temp 97.6 F (36.4 C) (Oral)   Resp 16   Ht 1.6 m (5\' 3" )  Wt 81.6 kg   SpO2 100%   BMI 31.89 kg/m   Physical Exam Vitals signs and nursing note reviewed.  Constitutional:      General: She is not in acute distress.    Appearance: She is well-developed.  HENT:     Head: Normocephalic and atraumatic.     Right Ear: External ear normal.     Left Ear: External ear normal.  Eyes:     General: No scleral icterus.       Right eye: No foreign body or discharge.        Left eye: No discharge.     Extraocular Movements: Extraocular movements intact.     Conjunctiva/sclera:     Right eye: Hemorrhage present. No exudate.    Pupils:     Right eye: No corneal abrasion or fluorescein uptake.     Comments: Woods lamp used  Neck:     Musculoskeletal: Neck supple.     Trachea: No tracheal deviation.  Cardiovascular:     Rate and Rhythm: Normal rate.  Pulmonary:     Effort: Pulmonary effort is normal. No respiratory distress.     Breath sounds: No stridor.  Abdominal:     General: There is no distension.  Musculoskeletal:        General: No swelling or deformity.  Skin:    General: Skin is warm and dry.      Findings: No rash.  Neurological:     Mental Status: She is alert.     Cranial Nerves: Cranial nerve deficit: no gross deficits.      ED Treatments / Results   Procedures Procedures (including critical care time)  Medications Ordered in ED Medications  fluorescein ophthalmic strip 1 strip (1 strip Both Eyes Given 12/21/18 0928)  tetracaine (PONTOCAINE) 0.5 % ophthalmic solution 2 drop (2 drops Both Eyes Given by Other 12/21/18 1157)     Initial Impression / Assessment and Plan / ED Course  I have reviewed the triage vital signs and the nursing notes.  Pertinent labs & imaging results that were available during my care of the patient were reviewed by me and considered in my medical decision making (see chart for details).   No sign of corneal abrasion.  Exam is consistent with a subconjunctival hemorrhage.  Discussed use of Tylenol and lubricating eyedrops as needed.  Ice pack to help with the swelling.  Follow-up with an eye doctor as needed.  Final Clinical Impressions(s) / ED Diagnoses   Final diagnoses:  Subconjunctival hemorrhage of right eye    ED Discharge Orders    None       Dorie Rank, MD 12/21/18 [phone]

## 2018-12-21 NOTE — ED Triage Notes (Signed)
Pt reports her dog scratched her eye while playing with her this morning; denies loss of vision

## 2018-12-21 NOTE — Discharge Instructions (Addendum)
Apply ice to help with the swelling, you can use over-the-counter Tylenol or lubricating eyedrops as needed for discomfort

## 2019-04-07 ENCOUNTER — Encounter: Payer: Self-pay | Admitting: Cardiovascular Disease

## 2019-05-12 ENCOUNTER — Other Ambulatory Visit: Payer: Self-pay

## 2019-05-12 ENCOUNTER — Encounter: Payer: Self-pay | Admitting: Cardiovascular Disease

## 2019-05-12 ENCOUNTER — Ambulatory Visit: Payer: Medicare Other | Admitting: Cardiovascular Disease

## 2019-05-12 VITALS — BP 114/64 | HR 82 | Temp 97.9°F | Ht 63.0 in | Wt 192.0 lb

## 2019-05-12 DIAGNOSIS — R072 Precordial pain: Secondary | ICD-10-CM

## 2019-05-12 DIAGNOSIS — E785 Hyperlipidemia, unspecified: Secondary | ICD-10-CM

## 2019-05-12 LAB — LIPID PANEL
Chol/HDL Ratio: 2.5 ratio (ref 0.0–4.4)
Cholesterol, Total: 171 mg/dL (ref 100–199)
HDL: 68 mg/dL (ref >39)
LDL Chol Calc (NIH): 94 mg/dL (ref 0–99)
Triglycerides: 44 mg/dL (ref 0–149)
VLDL Cholesterol Cal: 9 mg/dL (ref 5–40)

## 2019-05-12 LAB — HEPATIC FUNCTION PANEL
ALT: 12 IU/L (ref 0–32)
AST: 17 IU/L (ref 0–40)
Albumin: 4.2 g/dL (ref 3.8–4.8)
Alkaline Phosphatase: 96 IU/L (ref 39–117)
Bilirubin Total: 0.5 mg/dL (ref 0.0–1.2)
Bilirubin, Direct: 0.15 mg/dL (ref 0.00–0.40)
Total Protein: 6.6 g/dL (ref 6.0–8.5)

## 2019-05-12 NOTE — Assessment & Plan Note (Signed)
Melissa Buchanan was referred by Dr. Dion Saucier  for 1 episode of brief atypical chest pain approximately a month ago.  She had associated palpitations with radiation to her left upper extremity.  She was evaluated by Dr. Tamala Julian 11/14/2014 and had a routine GXT that was essentially normal.  She has essentially no cardiac risk factors.  I am to get a coronary calcium score to further evaluate.

## 2019-05-12 NOTE — Addendum Note (Signed)
Addended by: Cain Sieve on: 05/12/2019 10:12 AM   Modules accepted: Orders

## 2019-05-12 NOTE — Progress Notes (Signed)
05/12/2019 RIYLEE SOULLIERE   [birth year]  [id number]  Primary Physician Jonathon Resides, MD Primary Cardiologist: Lorretta Harp MD Lupe Carney, [place]  HPI:  Melissa Buchanan is a 68 y.o. mild to moderately overweight married African-American female mother of 1 child with no grandchildren yet referred by Dr. Dion Saucier  for cardiac evaluation because of atypical chest pain.  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  Her risk factors for CAD really nonexistent.  Her mother did have a myocardial infarction in her 86s.  She is never had a heart attack or stroke.  She does walk 5 miles a day without limitation or symptoms.  She was evaluated by Dr. Tamala Julian 11/14/2014 and had a routine GXT performed 01/06/2015 which was essentially normal.  She had an episode of chest pain approxi-1 month ago lasting less than a minute with some tachypalpitations associated with that and some left upper extremity radiation.  This was at rest.  They resolved spontaneously.   Current Meds  Medication Sig  . famotidine (PEPCID) 20 MG tablet Take 20 mg by mouth 2 (two) times daily.  . metoprolol succinate (TOPROL-XL) 25 MG 24 hr tablet Take 25 mg by mouth daily.  . Vitamin D, Ergocalciferol, (DRISDOL) 50000 units CAPS capsule Take 1 capsule (50,000 Units total) by mouth every 7 (seven) days.     Allergies  Allergen Reactions  . Eggs Or Egg-Derived Products Diarrhea and Nausea And Vomiting    Social History   Socioeconomic History  . Marital status: Married    Spouse name: Not on file  . Number of children: Not on file  . Years of education: Not on file  . Highest education level: Not on file  Occupational History  . Not on file  Social Needs  . Financial resource strain: Not on file  . Food insecurity    Worry: Not on file    Inability: Not on file  . Transportation needs    Medical: Not on file    Non-medical: Not on file  Tobacco Use  . Smoking status:  Never Smoker  . Smokeless tobacco: Never Used  Substance and Sexual Activity  . Alcohol use: No  . Drug use: No  . Sexual activity: Never    Birth control/protection: Post-menopausal  Lifestyle  . Physical activity    Days per week: Not on file    Minutes per session: Not on file  . Stress: Not on file  Relationships  . Social Herbalist on phone: Not on file    Gets together: Not on file    Attends religious service: Not on file    Active member of club or organization: Not on file    Attends meetings of clubs or organizations: Not on file    Relationship status: Not on file  . Intimate partner violence    Fear of current or ex partner: Not on file    Emotionally abused: Not on file    Physically abused: Not on file    Forced sexual activity: Not on file  Other Topics Concern  . Not on file  Social History Narrative   Marital Status: Married [name])   Children:  Daughter [name])    Pets: None    Living Situation: Lives with her husband.     Occupation:  Retired     Education: Forensic psychologist   Tobacco Use/Exposure:  None  Alcohol Use:  Occasional   Drug Use:  None   Diet:  Regular   Exercise:  None   Hobbies: Bible Study     Review of Systems: General: negative for chills, fever, night sweats or weight changes.  Cardiovascular: negative for chest pain, dyspnea on exertion, edema, orthopnea, palpitations, paroxysmal nocturnal dyspnea or shortness of breath Dermatological: negative for rash Respiratory: negative for cough or wheezing Urologic: negative for hematuria Abdominal: negative for nausea, vomiting, diarrhea, bright red blood per rectum, melena, or hematemesis Neurologic: negative for visual changes, syncope, or dizziness All other systems reviewed and are otherwise negative except as noted above.    Blood pressure 114/64, pulse 82, temperature 97.9 F (36.6 C), height 5\' 3"  (1.6 m), weight 192 lb (87.1 kg).  General appearance: alert and no  distress Neck: no adenopathy, no carotid bruit, no JVD, supple, symmetrical, trachea midline and thyroid not enlarged, symmetric, no tenderness/mass/nodules Lungs: clear to auscultation bilaterally Heart: regular rate and rhythm, S1, S2 normal, no murmur, click, rub or gallop Extremities: extremities normal, atraumatic, no cyanosis or edema Pulses: 2+ and symmetric Skin: Skin color, texture, turgor normal. No rashes or lesions Neurologic: Alert and oriented X 3, normal strength and tone. Normal symmetric reflexes. Normal coordination and gait  EKG sinus rhythm at 82 with left ventricular hypertrophy by voltage, nonspecific ST and T wave changes.  I personally reviewed this EKG.  ASSESSMENT AND PLAN:   Chest pain Ms. Mauger was referred by Dr. Dion Saucier  for 1 episode of brief atypical chest pain approximately a month ago.  She had associated palpitations with radiation to her left upper extremity.  She was evaluated by Dr. Tamala Julian 11/14/2014 and had a routine GXT that was essentially normal.  She has essentially no cardiac risk factors.  I am to get a coronary calcium score to further evaluate.      Lorretta Harp MD FACP,FACC,FAHA, [facility] 05/12/2019 10:04 AM

## 2019-05-12 NOTE — Patient Instructions (Signed)
Medication Instructions:  Your physician recommends that you continue on your current medications as directed. Please refer to the Current Medication list given to you today.  If you need a refill on your cardiac medications before your next appointment, please call your pharmacy.   Lab work: Lipids and Hepatic Function If you have labs (blood work) drawn today and your tests are completely normal, you will receive your results only by: [place] (if you have MyChart) OR A paper copy in the mail If you have any lab test that is abnormal or we need to change your treatment, we will call you to review the results.  Testing/Procedures: Coronary Calcium Score  Follow-Up: At [facility], you and your health needs are our priority.  As part of our continuing mission to provide you with exceptional heart care, we have created designated Provider Care Teams.  These Care Teams include your primary Cardiologist (physician) and Advanced Practice Providers (APPs -  Physician Assistants and Nurse Practitioners) who all work together to provide you with the care you need, when you need it. You may see Dr Gwenlyn Found or one of the following Advanced Practice Providers on your designated Care Team:    Kerin Ransom, PA-C  [place], [place]  Coletta Memos, [place]  Your physician wants you to follow-up as needed.  Any Other Special Instructions Will Be Listed Below (If Applicable).  Coronary Calcium Scan A coronary calcium scan is an imaging test used to look for deposits of calcium and other fatty materials (plaques) in the inner lining of the blood vessels of the heart (coronary arteries). These deposits of calcium and plaques can partly clog and narrow the coronary arteries without producing any symptoms or warning signs. This puts a person at risk for a heart attack. This test can detect these deposits before symptoms develop. Tell a health care provider about:  Any allergies you have.  All  medicines you are taking, including vitamins, herbs, eye drops, creams, and over-the-counter medicines.  Any problems you or family members have had with anesthetic medicines.  Any blood disorders you have.  Any surgeries you have had.  Any medical conditions you have.  Whether you are pregnant or may be pregnant. What are the risks? Generally, this is a safe procedure. However, problems may occur, including:  Harm to a pregnant woman and her unborn baby. This test involves the use of radiation. Radiation exposure can be dangerous to a pregnant woman and her unborn baby. If you are pregnant, you generally should not have this procedure done.  Slight increase in the risk of cancer. This is because of the radiation involved in the test. What happens before the procedure? No preparation is needed for this procedure. What happens during the procedure?   You will undress and remove any jewelry around your neck or chest.  You will put on a hospital gown.  Sticky electrodes will be placed on your chest. The electrodes will be connected to an electrocardiogram (ECG) machine to record a tracing of the electrical activity of your heart.  A CT scanner will take pictures of your heart. During this time, you will be asked to lie still and hold your breath for 2-3 seconds while a picture of your heart is being taken. The procedure may vary among health care providers and hospitals. What happens after the procedure?  You can get dressed.  You can return to your normal activities.  It is up to you to get the results of  your test. Ask your health care provider, or the department that is doing the test, when your results will be ready. Summary  A coronary calcium scan is an imaging test used to look for deposits of calcium and other fatty materials (plaques) in the inner lining of the blood vessels of the heart (coronary arteries).  Generally, this is a safe procedure. Tell your health care  provider if you are pregnant or may be pregnant.  No preparation is needed for this procedure.  A CT scanner will take pictures of your heart.  You can return to your normal activities after the scan is done. This information is not intended to replace advice given to you by your health care provider. Make sure you discuss any questions you have with your health care provider. Document Released: 11/16/2007 Document Revised: 05/02/2017 Document Reviewed: 04/08/2016 Elsevier Patient Education  2020 [employer].

## 2019-05-25 ENCOUNTER — Ambulatory Visit (INDEPENDENT_AMBULATORY_CARE_PROVIDER_SITE_OTHER)
Admission: RE | Admit: 2019-05-25 | Discharge: 2019-05-25 | Disposition: A | Discharge status: Home / Self Care | Payer: Self-pay | Source: Ambulatory Visit | Attending: Cardiovascular Disease | Admitting: Cardiovascular Disease

## 2019-05-25 ENCOUNTER — Other Ambulatory Visit: Payer: Self-pay

## 2019-05-25 DIAGNOSIS — R072 Precordial pain: Secondary | ICD-10-CM

## 2020-08-22 IMAGING — CT CT HEART SCORING
2 series · 16 of 20 positions shown, 18 images · non-contrast
Comparison: None.
COMPARISON: None.

Addendum:
EXAM:
OVER-READ INTERPRETATION  CT CHEST

The following report is an over-read performed by radiologist Dr.
Omogbai Taoheed [REDACTED] on 05/25/2019. This
over-read does not include interpretation of cardiac or coronary
anatomy or pathology. The coronary calcium score interpretation by
the cardiologist is attached.
CLINICAL DATA: Risk stratification
Coronary Calcium Score
TECHNIQUE: The patient was scanned on a Siemens Somatom 64 slice scanner. Axial
non-contrast 3 mm slices were carried out through the heart. The
data set was analyzed on a dedicated work station and scored using
the Agatson method.

[Series 2: casc 3.0 i36f 2 bestdiast 67 % · axial · 0.31mm/px · z∈[-257,-161]mm · 8 of 42 slices shown, 10 images]
[im 5/42  vessel]
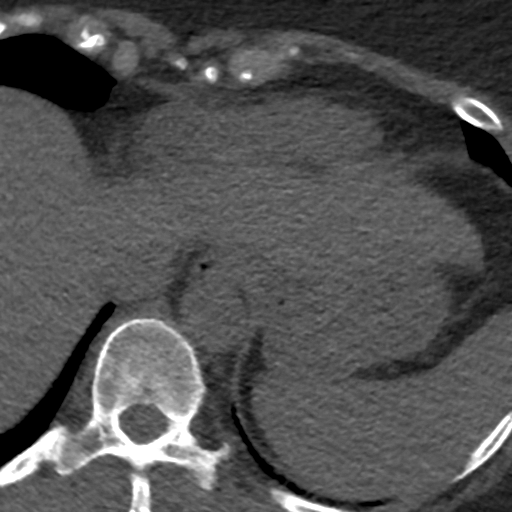
[im 5/42  lung]
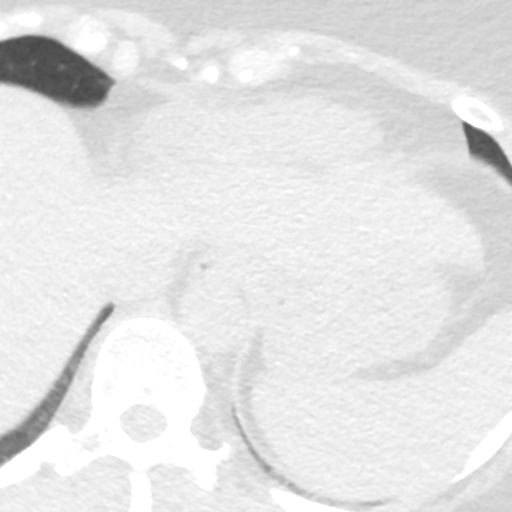
[im 10/42  vessel]
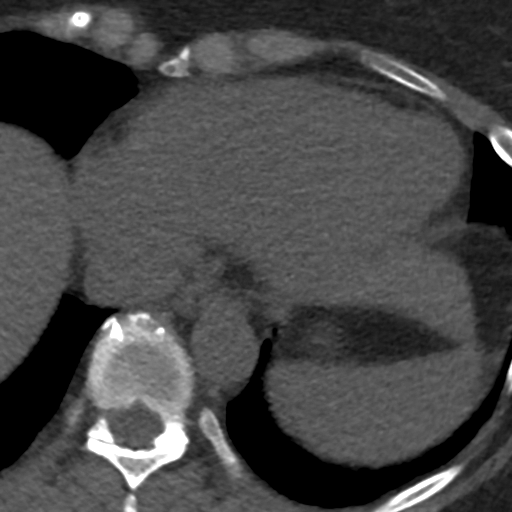
[im 14/42  vessel]
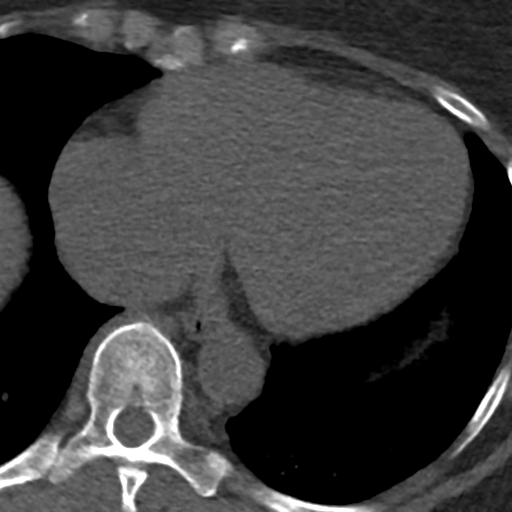
[im 19/42  vessel]
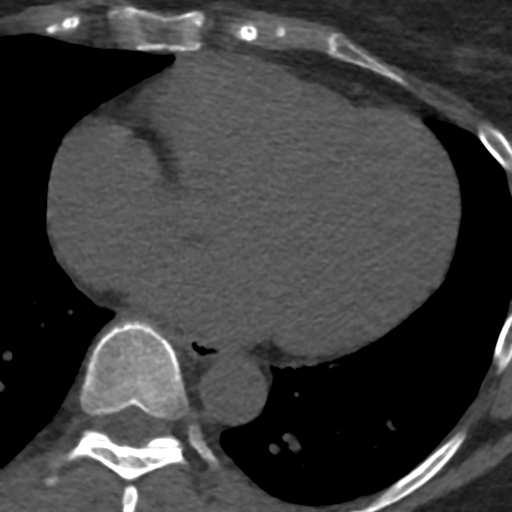
[im 23/42  vessel]
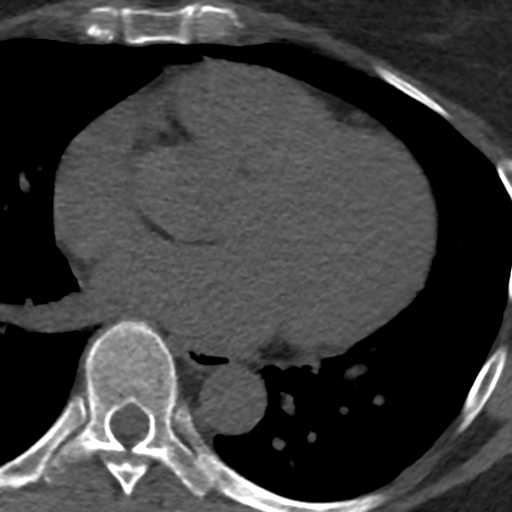
[im 23/42  lung]
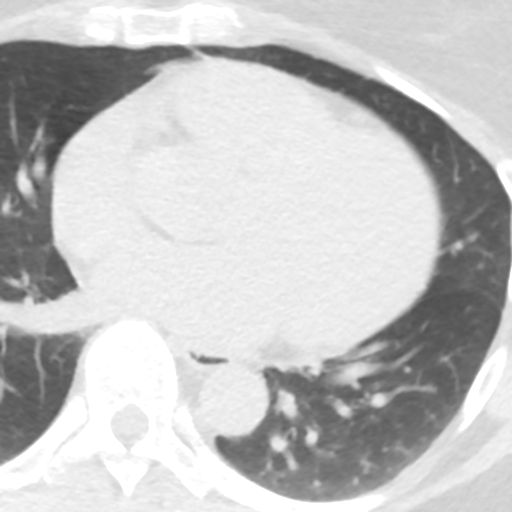
[im 28/42  vessel]
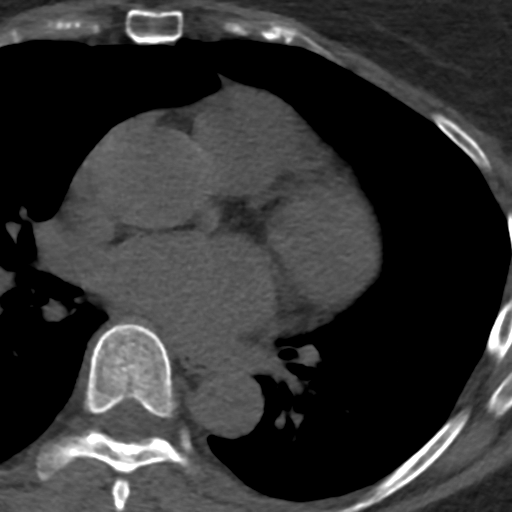
[im 32/42  vessel]
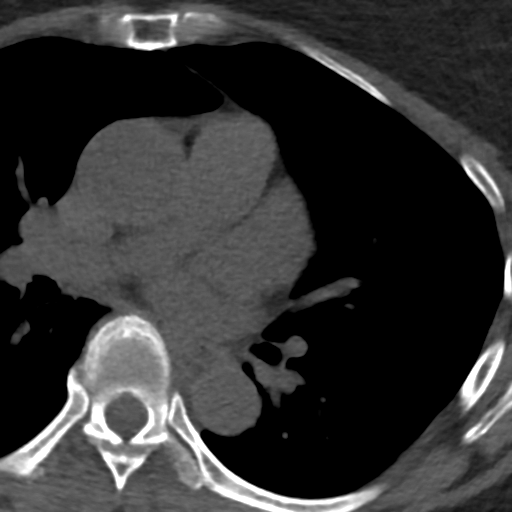
[im 37/42  vessel]
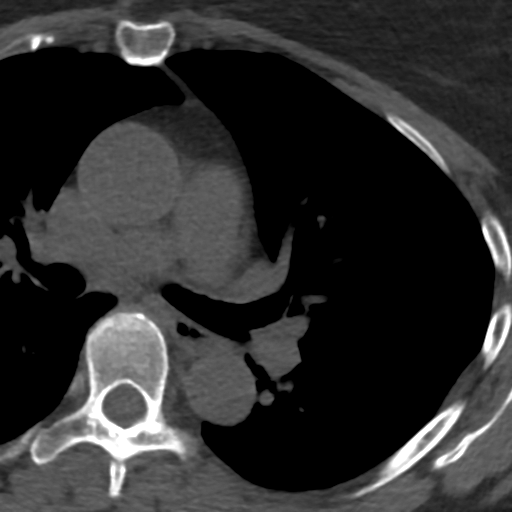

[Series 4: lung st 66 % · axial · 0.62mm/px · z∈[-257,-161]mm · 8 of 42 slices shown]
[im 5/42  lung]
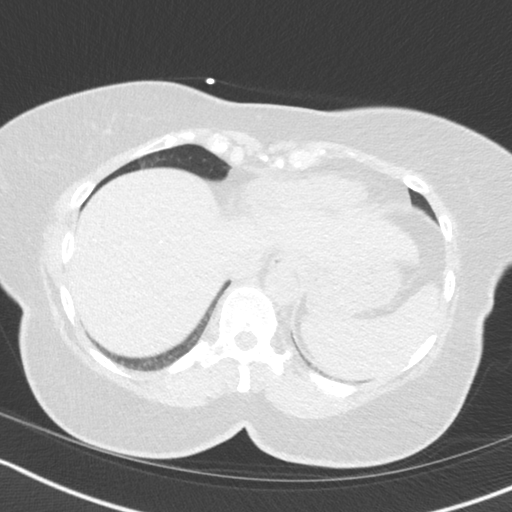
[im 10/42  lung]
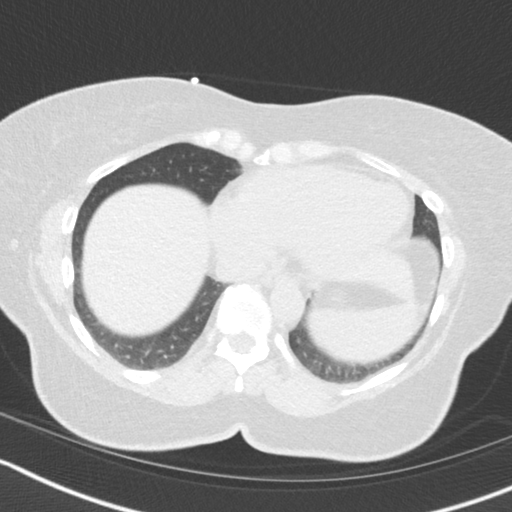
[im 14/42  lung]
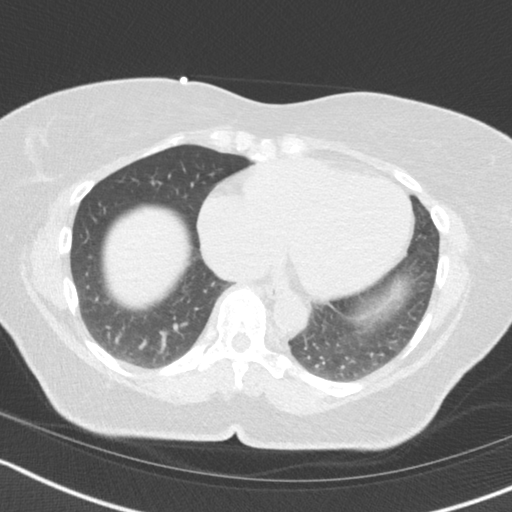
[im 19/42  lung]
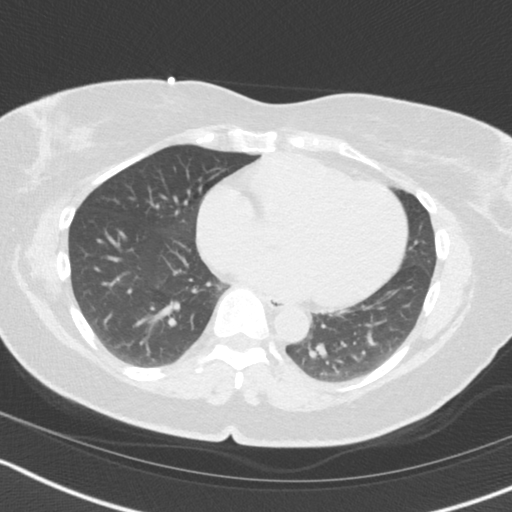
[im 23/42  lung]
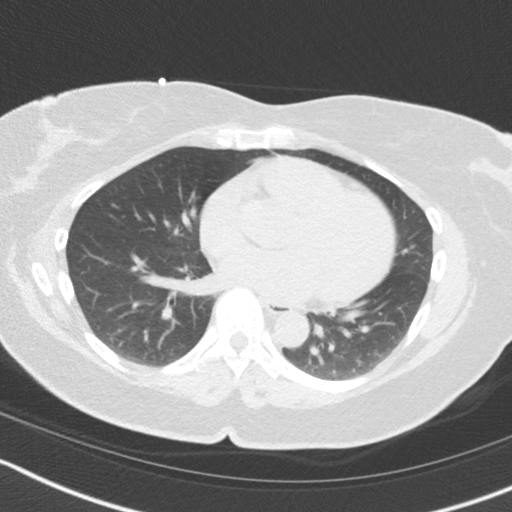
[im 28/42  lung]
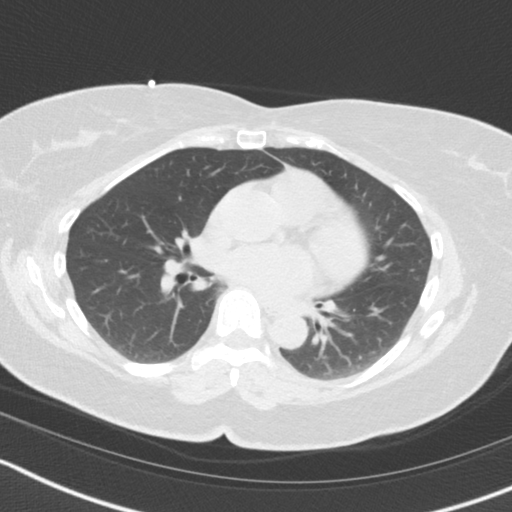
[im 32/42  lung]
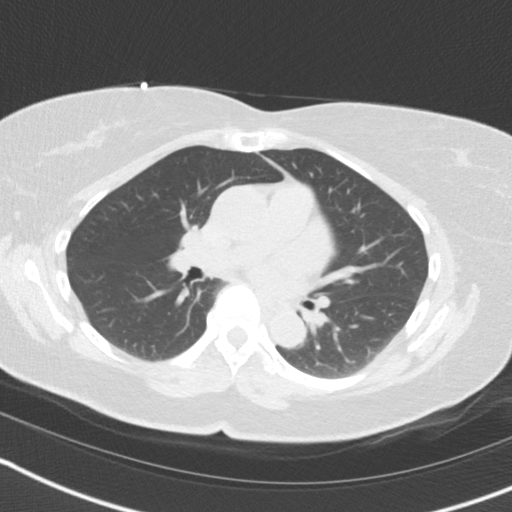
[im 37/42  lung]
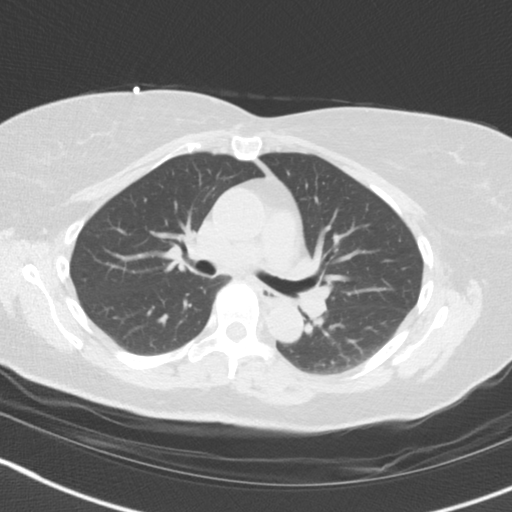

[16 of 20 positions shown; findings below may reference images not displayed]

FINDINGS: Vascular: Heart is normal size.  Aorta is normal caliber.

Mediastinum/Nodes: No adenopathy in the lower mediastinum or hila.

Lungs/Pleura: Visualized lungs clear.  No effusions.

Upper Abdomen: Imaging into the upper abdomen shows no acute
findings.

Musculoskeletal: Chest wall soft tissues are unremarkable. No acute
bony abnormality.
IMPRESSION: No acute or significant extracardiac abnormality.
FINDINGS: Non-cardiac: See separate report from [REDACTED].

Ascending aorta: Normal size

Pericardium: Normal

Coronary arteries: Normal origin
IMPRESSION: Coronary calcium score of 0. This was 0 percentile for age and sex
matched control.

Gavi Orengo

*** End of Addendum ***
EXAM:
OVER-READ INTERPRETATION  CT CHEST

The following report is an over-read performed by radiologist Dr.
Omogbai Taoheed [REDACTED] on 05/25/2019. This
over-read does not include interpretation of cardiac or coronary
anatomy or pathology. The coronary calcium score interpretation by
the cardiologist is attached.
FINDINGS: Vascular: Heart is normal size.  Aorta is normal caliber.

Mediastinum/Nodes: No adenopathy in the lower mediastinum or hila.

Lungs/Pleura: Visualized lungs clear.  No effusions.

Upper Abdomen: Imaging into the upper abdomen shows no acute
findings.

Musculoskeletal: Chest wall soft tissues are unremarkable. No acute
bony abnormality.
IMPRESSION: No acute or significant extracardiac abnormality.

## 2021-12-10 ENCOUNTER — Ambulatory Visit (INDEPENDENT_AMBULATORY_CARE_PROVIDER_SITE_OTHER): Payer: Medicare PPO | Admitting: Family Medicine

## 2021-12-10 ENCOUNTER — Ambulatory Visit (INDEPENDENT_AMBULATORY_CARE_PROVIDER_SITE_OTHER): Payer: Medicare PPO

## 2021-12-10 ENCOUNTER — Ambulatory Visit: Payer: Self-pay

## 2021-12-10 VITALS — BP 118/72 | HR 79 | Ht 63.0 in | Wt 167.0 lb

## 2021-12-10 DIAGNOSIS — M25511 Pain in right shoulder: Secondary | ICD-10-CM

## 2021-12-10 NOTE — Progress Notes (Signed)
I, Vilma Meckel, LAT, ATC, am serving as scribe for Dr. Lynne Leader.  Melissa Buchanan is a 71 y.o. female who presents to [facility] today for R arm pain.  She was last seen by Dr. Tamala Julian on 10/22/17 for leg pain/MTSS.  Today, pt reports R arm pain since .  She locates her pain to . She is unable to actively move arm into flexion and abduction. Extension and shoulder shrugs are pain free. No MOI. Just sitting there when shoulder starting hurting. No numbness or tingling to fingers. Rest did seem to help. Pain started this past Saturday.   She denies any radiating pain weakness or numbness distally.  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  This pain feels different.  She notes that she has been lifting weights for months and cannot recall any injury that might of caused shoulder pain.  Pertinent review of systems: No fevers or chills.  Relevant historical information: Adhesive capsulitis right shoulder.   Exam:  BP 118/72   Pulse 79   Ht '5\' 3"'$  (1.6 m)   Wt 167 lb (75.8 kg)   SpO2 95%   BMI 29.58 kg/m  General: Well Developed, well nourished, and in no acute distress.   MSK: Right shoulder: Normal-appearing Nontender. Normal range of motion however significant pain with abduction and forward flexion. Positive Hawkins and Neer's test.  Positive empty can test. Negative Yergason's and speeds test. Strength 4/5 abduction and external rotation.  5/5 internal rotation.    Lab and Radiology Results  Procedure: Real-time Ultrasound Guided Injection of right shoulder subdeltoid bursa Device: Philips Affiniti 50G Images permanently stored and available for review in PACS Ultrasound evaluation prior to injection reveals intact rotator cuff tendons with mild subdeltoid bursitis.  Calcific change present within the distal tendon  insertion site of the supraspinatus tendon. Verbal informed consent obtained.  Discussed risks and benefits of procedure. Warned about infection, bleeding, hyperglycemia damage to structures among others. Patient expresses understanding and agreement Time-out conducted.   Noted no overlying erythema, induration, or other signs of local infection.   Skin prepped in a sterile fashion.   Local anesthesia: Topical Ethyl chloride.   With sterile technique and under real time ultrasound guidance: 40 mg of Kenalog and 2 mL of Marcaine injected into subdeltoid bursa. Fluid seen entering the bursa.   Completed without difficulty   Pain immediately resolved suggesting accurate placement of the medication.   Advised to call if fevers/chills, erythema, induration, drainage, or persistent bleeding.   Images permanently stored and available for review in the ultrasound unit.  Impression: Technically successful ultrasound guided injection.    X-ray images right shoulder obtained today personally and independently interpreted. No significant DJD.  No acute fractures. Await formal radiology review     Assessment and Plan: 71 y.o. female with right shoulder pain thought to be due to exacerbation of right rotator cuff tendinitis and subdeltoid bursitis.  There may be a small rotator cuff tear not well seen on today's ultrasound.  Plan for physical therapy and subdeltoid bursa injection.  Recheck in about 6 weeks.   PDMP not reviewed this encounter. Orders Placed This Encounter  Procedures   [place] LIMITED JOINT SPACE STRUCTURES UP RIGHT(NO LINKED CHARGES)    Order Specific Question:   Reason for Exam (SYMPTOM  OR DIAGNOSIS REQUIRED)  Answer:   R shoulder pain    Order Specific Question:   Preferred imaging location?    Answer:   [place]   DG Shoulder Right    Standing Status:   Future    Number of Occurrences:   1    Standing Expiration Date:   01/10/2022    Order  Specific Question:   Reason for Exam (SYMPTOM  OR DIAGNOSIS REQUIRED)    Answer:   R shoulder pain    Order Specific Question:   Preferred imaging location?    Answer:   [facility]   Ambulatory referral to Physical Therapy    Referral Priority:   Routine    Referral Type:   Physical Medicine    Referral Reason:   Specialty Services Required    Requested Specialty:   Physical Therapy    Number of Visits Requested:   1   No orders of the defined types were placed in this encounter.    Discussed warning signs or symptoms. Please see discharge instructions. Patient expresses understanding.   The above documentation has been reviewed and is accurate and complete Lynne Leader, M.D.

## 2021-12-10 NOTE — Patient Instructions (Signed)
Good to see you today.  You had a R shoulder injection.  Call or go to the ER if you develop a large red swollen joint with extreme pain or oozing puss.   I've referred you to Physical Therapy at [facility].  Their office will call you to schedule but please let us know if you don't hear from them in one week regarding scheduling.  Please get an Xray today before you leave   Follow-up: 6 weeks

## 2021-12-11 NOTE — Progress Notes (Signed)
Right shoulder x-ray looks normal to radiology

## 2021-12-12 ENCOUNTER — Telehealth: Payer: Self-pay | Admitting: Family Medicine

## 2021-12-12 NOTE — Telephone Encounter (Signed)
Patient was seen on 12/10/2021. She called stating that her right arm is very painful. She asked what she should do or if there is something that she can take?  Please advise.

## 2021-12-12 NOTE — Telephone Encounter (Signed)
I called Melissa Buchanan.  She is describing symptoms consistent with a steroid flare.  Currently pain is controlled with Tylenol.  Recommend a bit of watchful waiting with Tylenol and if not improved tomorrow the next day let me know.  Expect it will start improving after a day or 2.

## 2021-12-14 ENCOUNTER — Telehealth: Payer: Self-pay | Admitting: Family Medicine

## 2021-12-14 NOTE — Telephone Encounter (Signed)
I called and answered her questions. She is feeling better but I recommend physical therapy.

## 2021-12-14 NOTE — Therapy (Signed)
OUTPATIENT PHYSICAL THERAPY SHOULDER EVALUATION   Patient Name: Melissa Buchanan MRN: [id number] DOB:15-Mar-1951, 71 y.o., female Today's Date: 12/17/2021   PT End of Session - 12/17/21 0918     Visit Number 1    Date for PT Re-Evaluation 02/18/22    Authorization Type [employer]    PT Start Time 0925    PT Stop Time 1010    PT Time Calculation (min) 45 min    Activity Tolerance Patient tolerated treatment well    Behavior During Therapy WFL for tasks assessed/performed             Past Medical History:  Diagnosis Date   Chicken pox    71 years old   Fibroids    H/O   Migraines    71 years old   No pertinent past medical history    PMB (postmenopausal bleeding)    H/O   Seasonal allergies    Past Surgical History:  Procedure Laterality Date   BUNIONECTOMY  02/25/2012   ENDOMETRIAL BIOPSY     HYSTEROSCOPY  11/12   WITH RESECTION D & C   MYOMECTOMY     WISDOM TOOTH EXTRACTION     Patient Active Problem List   Diagnosis Date Noted   Stress fracture of lower leg 06/11/2017   Chest pain 11/13/2014   Routine general medical examination at a health care facility 05/09/2013   Need for prophylactic vaccination with combined diphtheria-tetanus-pertussis (DTP) vaccine 05/09/2013   Need for prophylactic vaccination and inoculation against other viral diseases(V04.89) 05/09/2013    PCP: Ival Bible   REFERRING PROVIDER: Sherene Sires   REFERRING DIAG: M25.511   THERAPY DIAG:  Acute pain of right shoulder  Stiffness of right shoulder, not elsewhere classified  Muscle weakness (generalized)  Rationale for Evaluation and Treatment Rehabilitation  ONSET DATE: 12/10/21  SUBJECTIVE:                                                                                                                                                                                      SUBJECTIVE STATEMENT: I was sitting outside at my friend's party last Saturday and my muscles started  to ache. The pain has gotten better, got a cortisone shot. 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   PERTINENT HISTORY: N/A  PAIN:   Pain levels at worst** at the time of eval pt reports no pain  Are you having pain? Yes: NPRS scale: 10/10 Pain location: R shoulder and arm Pain description: achy, sore, throbbing Aggravating factors: lifting up Relieving factors: tylenol  PRECAUTIONS: None  WEIGHT BEARING RESTRICTIONS No  FALLS:  Has patient fallen in last 6 months? No  LIVING ENVIRONMENT: Lives with: lives with their spouse Lives in: House/apartment Stairs: Yes: Internal: 12 steps; on right going up Has following equipment at home: None  OCCUPATION: retired  PLOF: Independent  PATIENT GOALS get my arm back above my head   OBJECTIVE:   DIAGNOSTIC FINDINGS:  FINDINGS: Osseous alignment is normal. Bone mineralization is normal. No fracture line or displaced fracture fragment. No degenerative changes seen at the glenohumeral or acromioclavicular joint spaces. Soft tissues about the RIGHT shoulder are unremarkable.   IMPRESSION: Negative.    PATIENT SURVEYS:  FOTO 46/100  COGNITION:  Overall cognitive status: Within functional limits for tasks assessed     SENSATION: WFL  POSTURE: Rounded shoulders, forward head   UPPER EXTREMITY ROM: L WNL  Active ROM Right eval  Shoulder flexion 90  Shoulder extension   Shoulder abduction 80  Shoulder adduction   Shoulder internal rotation 60  Shoulder external rotation 50  Elbow flexion   Elbow extension   Wrist flexion   Wrist extension   Wrist ulnar deviation   Wrist radial deviation   Wrist pronation   Wrist supination   (Blank rows = not tested)  UPPER EXTREMITY MMT:    MMT Right eval  Shoulder flexion 2+  Shoulder extension   Shoulder abduction 2 w/pain  Shoulder adduction   Shoulder internal rotation 4  Shoulder external rotation 3+  Middle trapezius   Lower  trapezius   Elbow flexion 5  Elbow extension 5  Wrist flexion   Wrist extension   Wrist ulnar deviation   Wrist radial deviation   Wrist pronation   Wrist supination   Grip strength (lbs)   (Blank rows = not tested)  SHOULDER SPECIAL TESTS:  Impingement tests: Neer impingement test: positive , Hawkins/Kennedy impingement test: negative, and Painful arc test: positive     Rotator cuff assessment: Empty can test: positive   JOINT MOBILITY TESTING:  Full PROM into flexion, abduction, IR and ER, slight pain with end range with flexion and ER     TODAY'S TREATMENT:  UBE L4 x60mns  HEP:  AAROM shoulder flexion and ABD redTB IR/ER Wall slides    PATIENT EDUCATION: Education details: POC Person educated: Patient Education method: Explanation Education comprehension: verbalized understanding   HOME EXERCISE PROGRAM: AAROM shoulder flexion and ABD RedTB IR/ER Wall slides   ASSESSMENT:  CLINICAL IMPRESSION: Patient is a 71 y.o. female who was seen today for physical therapy evaluation and treatment for R shoulder pain. Patient presents with symptoms consistent with R shoulder impingement and possible bursitis. She presents with positive painful arc especially with movements coming back down. She has limitations in range of motion and strength, and would benefit from skilled PT intervention to address this as well as her pain.   REHAB POTENTIAL: Good  CLINICAL DECISION MAKING: Stable/uncomplicated  EVALUATION COMPLEXITY: Low   GOALS: Goals reviewed with patient? Yes  SHORT TERM GOALS: Target date: 01/14/22  Patient will be independent with initial HEP.  Goal status: INITIAL    LONG TERM GOALS: Target date: 02/18/22  1.  Patient to improve R shoulder [facility]OM [facility] provocation to allow for increased ease of ADLs.  Goal status: INITIAL  5.  Patient will demonstrate improved functional UE strength as demonstrated by increase by 1 grade in all weak  groups. Goal status: INITIAL  6  Patient will report 724on FOTO (patient outcome measure)  to demonstrate improved functional  ability.  Baseline: 46 Goal status: INITIAL  PLAN: PT FREQUENCY: 2x/week  PT DURATION: 8 weeks  PLANNED INTERVENTIONS: Therapeutic exercises, Therapeutic activity, Neuromuscular re-education, Balance training, Gait training, Patient/Family education, Self Care, Joint mobilization, Electrical stimulation, Cryotherapy, Moist heat, Vasopneumatic device, Ultrasound, Ionotophoresis '4mg'$ /ml Dexamethasone, and Manual therapy  PLAN FOR NEXT SESSION: ionto patch, AAROM, start strengthening exercises    Andris Baumann, PT 12/17/2021, 11:49 AM

## 2021-12-14 NOTE — Telephone Encounter (Signed)
Patient called to let Dr Georgina Snell know that her pain is much better but her arm is sore. Just FYI. Dr Georgina Snell asked her to call on Friday to let him know.

## 2021-12-17 ENCOUNTER — Ambulatory Visit: Payer: Medicare PPO | Attending: Family Medicine

## 2021-12-17 DIAGNOSIS — M25511 Pain in right shoulder: Secondary | ICD-10-CM | POA: Insufficient documentation

## 2021-12-17 DIAGNOSIS — M25611 Stiffness of right shoulder, not elsewhere classified: Secondary | ICD-10-CM | POA: Diagnosis present

## 2021-12-17 DIAGNOSIS — M6281 Muscle weakness (generalized): Secondary | ICD-10-CM | POA: Insufficient documentation

## 2021-12-19 ENCOUNTER — Ambulatory Visit: Payer: Medicare PPO | Admitting: Physical Therapy

## 2021-12-19 ENCOUNTER — Encounter: Payer: Self-pay | Admitting: Physical Therapy

## 2021-12-19 DIAGNOSIS — M25611 Stiffness of right shoulder, not elsewhere classified: Secondary | ICD-10-CM

## 2021-12-19 DIAGNOSIS — M25511 Pain in right shoulder: Secondary | ICD-10-CM

## 2021-12-19 DIAGNOSIS — M6281 Muscle weakness (generalized): Secondary | ICD-10-CM

## 2021-12-19 NOTE — Therapy (Signed)
OUTPATIENT PHYSICAL THERAPY SHOULDER EVALUATION   Patient Name: Melissa Buchanan MRN: [id number] DOB:05/31/1951, 71 y.o., female Today's Date: 12/19/2021   PT End of Session - 12/19/21 1505     Visit Number 2    Date for PT Re-Evaluation 02/18/22    Authorization Type [employer]    PT Start Time 1502    PT Stop Time 1540    PT Time Calculation (min) 38 min    Activity Tolerance Patient tolerated treatment well    Behavior During Therapy WFL for tasks assessed/performed              Past Medical History:  Diagnosis Date   Chicken pox    71 years old   Fibroids    H/O   Migraines    71 years old   No pertinent past medical history    PMB (postmenopausal bleeding)    H/O   Seasonal allergies    Past Surgical History:  Procedure Laterality Date   BUNIONECTOMY  02/25/2012   ENDOMETRIAL BIOPSY     HYSTEROSCOPY  11/12   WITH RESECTION D & C   MYOMECTOMY     WISDOM TOOTH EXTRACTION     Patient Active Problem List   Diagnosis Date Noted   Stress fracture of lower leg 06/11/2017   Chest pain 11/13/2014   Routine general medical examination at a health care facility 05/09/2013   Need for prophylactic vaccination with combined diphtheria-tetanus-pertussis (DTP) vaccine 05/09/2013   Need for prophylactic vaccination and inoculation against other viral diseases(V04.89) 05/09/2013    PCP: Ival Bible   REFERRING PROVIDER: Sherene Sires   REFERRING DIAG: M25.511   THERAPY DIAG:  Acute pain of right shoulder  Stiffness of right shoulder, not elsewhere classified  Muscle weakness (generalized)  Rationale for Evaluation and Treatment Rehabilitation  ONSET DATE: 12/10/21  SUBJECTIVE:                                                                                                                                                                                      SUBJECTIVE STATEMENT: Patient reports that her pain is much improved after her Cortizone  shot.  PERTINENT HISTORY: N/A  PAIN:   Pain levels at worst** at the time of eval pt reports no pain  Are you having pain? Yes: NPRS scale: 10/10 Pain location: R shoulder and arm Pain description: achy, sore, throbbing Aggravating factors: lifting up Relieving factors: tylenol  PRECAUTIONS: None  WEIGHT BEARING RESTRICTIONS No  FALLS:  Has patient fallen in last 6 months? No  LIVING ENVIRONMENT: Lives with: lives with their spouse Lives in: House/apartment Stairs: Yes: Internal: 12 steps; on right  going up Has following equipment at home: None  OCCUPATION: retired  PLOF: Independent  PATIENT GOALS get my arm back above my head   OBJECTIVE:   DIAGNOSTIC FINDINGS:  FINDINGS: Osseous alignment is normal. Bone mineralization is normal. No fracture line or displaced fracture fragment. No degenerative changes seen at the glenohumeral or acromioclavicular joint spaces. Soft tissues about the RIGHT shoulder are unremarkable.   IMPRESSION: Negative.    PATIENT SURVEYS:  FOTO 46/100  COGNITION:  Overall cognitive status: Within functional limits for tasks assessed     SENSATION: WFL  POSTURE: Rounded shoulders, forward head   UPPER EXTREMITY ROM: L WNL  Active ROM Right eval  Shoulder flexion 90  Shoulder extension   Shoulder abduction 80  Shoulder adduction   Shoulder internal rotation 60  Shoulder external rotation 50  Elbow flexion   Elbow extension   Wrist flexion   Wrist extension   Wrist ulnar deviation   Wrist radial deviation   Wrist pronation   Wrist supination   (Blank rows = not tested)  UPPER EXTREMITY MMT:    MMT Right eval  Shoulder flexion 2+  Shoulder extension   Shoulder abduction 2 w/pain  Shoulder adduction   Shoulder internal rotation 4  Shoulder external rotation 3+  Middle trapezius   Lower trapezius   Elbow flexion 5  Elbow extension 5  Wrist flexion   Wrist extension   Wrist ulnar deviation   Wrist radial  deviation   Wrist pronation   Wrist supination   Grip strength (lbs)   (Blank rows = not tested)  SHOULDER SPECIAL TESTS:  Impingement tests: Neer impingement test: positive , Hawkins/Kennedy impingement test: negative, and Painful arc test: positive     Rotator cuff assessment: Empty can test: positive   JOINT MOBILITY TESTING:  Full PROM into flexion, abduction, IR and ER, slight pain with end range with flexion and ER     TODAY'S TREATMENT:  12/19/21 UBE L4- 3 min for/3 min back Shoulder ext, rows, 2 x 10 reps with 10# resistance at power tower. Prone on ball with 1# weights, at 90 degrees horiz abd, 120 degrees, then full ext., should abd/add, ER/IR at 90 degrees 10 reps, then hold x 10 sec Wall stretch into flex and abd 4 x 10 sec Lat pulls 15#, 2 x 10 reps Back to wall, B shoulder ext holding 4# weight, extend to touch wall  Paloff press 2 x 10 reps, 10# resistance. Wall stars against yellow Tband resistance 5 reps B Finished with additional flexor and abd stretch, pect stretch    UBE L4 x 75mns  HEP:  AAROM shoulder flexion and ABD redTB IR/ER Wall slides    PATIENT EDUCATION: Education details: POC Person educated: Patient Education method: Explanation Education comprehension: verbalized understanding   HOME EXERCISE PROGRAM: AAROM shoulder flexion and ABD RedTB IR/ER Wall slides   ASSESSMENT:  CLINICAL IMPRESSION: Patient arrives reporting pain is gone. She tolerated progression of strengthening for shoulder, scapular, and postural stability with no C/O pain or fatigue. Therapist educated patient to correct starting posture to stabilize trunk.  REHAB POTENTIAL: Good  CLINICAL DECISION MAKING: Stable/uncomplicated  EVALUATION COMPLEXITY: Low   GOALS: Goals reviewed with patient? Yes  SHORT TERM GOALS: Target date: 01/14/22  Patient will be independent with initial HEP.  Goal status: INITIAL    LONG TERM GOALS: Target date: 02/18/22  1.   Patient to improve R shoulder [facility] provocation to allow for increased ease  of ADLs.  Goal status: INITIAL  5.  Patient will demonstrate improved functional UE strength as demonstrated by increase by 1 grade in all weak groups. Goal status: INITIAL  6  Patient will report 2 on FOTO (patient outcome measure)  to demonstrate improved functional ability.  Baseline: 46 Goal status: INITIAL  PLAN: PT FREQUENCY: 2x/week  PT DURATION: 8 weeks  PLANNED INTERVENTIONS: Therapeutic exercises, Therapeutic activity, Neuromuscular re-education, Balance training, Gait training, Patient/Family education, Self Care, Joint mobilization, Electrical stimulation, Cryotherapy, Moist heat, Vasopneumatic device, Ultrasound, Ionotophoresis '4mg'$ /ml Dexamethasone, and Manual therapy  PLAN FOR NEXT SESSION: ionto patch, AAROM, start strengthening exercises    Marcelina Morel, DPT 12/19/2021, 3:39 PM

## 2021-12-28 ENCOUNTER — Ambulatory Visit: Payer: Medicare PPO | Admitting: Physical Therapy

## 2021-12-28 DIAGNOSIS — M25611 Stiffness of right shoulder, not elsewhere classified: Secondary | ICD-10-CM

## 2021-12-28 DIAGNOSIS — M6281 Muscle weakness (generalized): Secondary | ICD-10-CM

## 2021-12-28 DIAGNOSIS — M25511 Pain in right shoulder: Secondary | ICD-10-CM | POA: Diagnosis not present

## 2021-12-28 NOTE — Therapy (Signed)
OUTPATIENT PHYSICAL THERAPY SHOULDER   Patient Name: Melissa Buchanan MRN: [id number] DOB:[birth year], 71 y.o., female Today's Date: 12/28/2021   PT End of Session - 12/28/21 0802     Visit Number 3    Date for PT Re-Evaluation 02/18/22    PT Start Time 0800    PT Stop Time 0840    PT Time Calculation (min) 40 min              Past Medical History:  Diagnosis Date   Chicken pox    71 years old   Fibroids    H/O   Migraines    71 years old   No pertinent past medical history    PMB (postmenopausal bleeding)    H/O   Seasonal allergies    Past Surgical History:  Procedure Laterality Date   BUNIONECTOMY  02/25/2012   ENDOMETRIAL BIOPSY     HYSTEROSCOPY  11/12   WITH RESECTION D & C   MYOMECTOMY     WISDOM TOOTH EXTRACTION     Patient Active Problem List   Diagnosis Date Noted   Stress fracture of lower leg 06/11/2017   Chest pain 11/13/2014   Routine general medical examination at a health care facility 05/09/2013   Need for prophylactic vaccination with combined diphtheria-tetanus-pertussis (DTP) vaccine 05/09/2013   Need for prophylactic vaccination and inoculation against other viral diseases(V04.89) 05/09/2013    PCP: Ival Bible   REFERRING PROVIDER: Sherene Sires   REFERRING DIAG: M25.511   THERAPY DIAG:  Stiffness of right shoulder, not elsewhere classified  Muscle weakness (generalized)  Rationale for Evaluation and Treatment Rehabilitation  ONSET DATE: 12/10/21  SUBJECTIVE:                                                                                                                                                                                      SUBJECTIVE STATEMENT: doing well, no pain no isues. Doing HEP and at gym. Sometimes heavy at end of day   PERTINENT HISTORY: N/A  PAIN:  0   PRECAUTIONS: None  WEIGHT BEARING RESTRICTIONS No  FALLS:  Has patient fallen in last 6 months? No  LIVING ENVIRONMENT: Lives with: lives with  their spouse Lives in: House/apartment Stairs: Yes: Internal: 12 steps; on right going up Has following equipment at home: None  OCCUPATION: retired  PLOF: Independent  PATIENT GOALS get my arm back above my head   OBJECTIVE:    UPPER EXTREMITY ROM: 12/28/21 Rt shld standing FULL    UPPER EXTREMITY MMT:   RT shld standing WFLs  JOINT MOBILITY TESTING:  Full PROM into flexion, abduction, IR and ER, slight pain with  end range with flexion and ER     TODAY'S TREATMENT:    12/28/21  UBE L 4 3 min fwd /3 min backward Lat Pull 20# 2 sets 12 Seated Row 20# 2 sets 12 Chest press 15# 2 sets 12 Standing PNF 4# 2 sets 10 BIL Empty can 4# 2 sets 10 BIL 4# standing rhy stab on wall 4 pts 10 each Cable pullet shld ext and row 10# 2 sets 10    12/19/21 UBE L4- 3 min for/3 min back Shoulder ext, rows, 2 x 10 reps with 10# resistance at power tower. Prone on ball with 1# weights, at 90 degrees horiz abd, 120 degrees, then full ext., should abd/add, ER/IR at 90 degrees 10 reps, then hold x 10 sec Wall stretch into flex and abd 4 x 10 sec Lat pulls 15#, 2 x 10 reps Back to wall, B shoulder ext holding 4# weight, extend to touch wall  Paloff press 2 x 10 reps, 10# resistance. Wall stars against yellow Tband resistance 5 reps B Finished with additional flexor and abd stretch, pect stretch    UBE L4 x 5mns  HEP:  Discussed gym ex PATIENT EDUCATION: Education details: gym ex and ROM Person educated: Patient Education method: Explanation Education comprehension: verbalized understanding   ASSESSMENT:  CLINICAL IMPRESSION pt reports no pain and doing well. Pt states at gym and resumed classes but using 5# vs  8# as she was prior. ROM and MMT WNLS. Progressing with all goals. Educ pt on some gym machines she can add at gym REHAB POTENTIAL: Good  CLINICAL DECISION MAKING: Stable/uncomplicated  EVALUATION COMPLEXITY: Low   GOALS: Goals reviewed with patient? Yes  SHORT  TERM GOALS: Target date: 01/14/22  Patient will be independent with initial HEP.  Goal status: met    LONG TERM GOALS: Target date: 02/18/22  1.  Patient to improve R shoulder [facility] to allow for increased ease of ADLs.  Goal status: met  5.  Patient will demonstrate improved functional UE strength as demonstrated by increase by 1 grade in all weak groups. Goal status: met  6  Patient will report 759on FOTO (patient outcome measure)  to demonstrate improved functional ability.  Baseline: 46 Goal status: INITIAL  PLAN: PT FREQUENCY: 2x/week  PT DURATION: 8 weeks  PLANNED INTERVENTIONS: Therapeutic exercises, Therapeutic activity, Neuromuscular re-education, Balance training, Gait training, Patient/Family education, Self Care, Joint mobilization, Electrical stimulation, Cryotherapy, Moist heat, Vasopneumatic device, Ultrasound, Ionotophoresis 421mml Dexamethasone, and Manual therapy  PLAN FOR NEXT SESSION: HOLD as pt is doing very well and doing HEP and at gym. If no f/u in 2 weeks okay to D/C  AnFranki MonteTA  12/28/2021, 8:03 AMNew [id number]: [phone] Fax:  [phone] Details  Name: Melissa EDMUNDSONRN: [id number] of Birth: [birth year]eferring Provider:  ZaJonathon ResidesMD  Encounter Date: 12/28/2021   PALaqueta CarinaPTA 12/28/2021, 8:02 AM  [place]illsborough[place] [id number]: [phone] Fax:  [phone]

## 2021-12-31 ENCOUNTER — Ambulatory Visit: Payer: Medicare PPO

## 2022-01-02 ENCOUNTER — Ambulatory Visit: Payer: Medicare PPO

## 2022-01-07 ENCOUNTER — Ambulatory Visit: Payer: Medicare PPO | Admitting: Physical Therapy

## 2022-01-09 ENCOUNTER — Ambulatory Visit: Payer: Medicare PPO

## 2022-01-14 ENCOUNTER — Ambulatory Visit: Payer: Medicare PPO

## 2022-01-16 ENCOUNTER — Ambulatory Visit: Payer: Medicare PPO

## 2022-01-18 NOTE — Progress Notes (Unsigned)
   I, Peterson Lombard, LAT, ATC acting as a scribe for Lynne Leader, MD.  Melissa Buchanan is a 71 y.o. female who presents to [place] at [facility] today for f/u R shoulder pain. Pt was last seen by Dr. Georgina Snell on 12/10/21 and was given a R subdeltoid steroid injection and was referred to PT, completing 3 visits. Today, pt reports  Dx imaging: 12/10/21 R shoulder XR  Pertinent review of systems: ***  Relevant historical information: ***   Exam:  There were no vitals taken for this visit. General: Well Developed, well nourished, and in no acute distress.   MSK: ***    Lab and Radiology Results No results found for this or any previous visit (from the past 72 hour(s)). No results found.     Assessment and Plan: 71 y.o. female with ***   PDMP not reviewed this encounter. No orders of the defined types were placed in this encounter.  No orders of the defined types were placed in this encounter.    Discussed warning signs or symptoms. Please see discharge instructions. Patient expresses understanding.   ***

## 2022-01-21 ENCOUNTER — Ambulatory Visit: Payer: Medicare PPO | Admitting: Physical Therapy

## 2022-01-21 ENCOUNTER — Ambulatory Visit: Payer: Medicare PPO | Admitting: Family Medicine

## 2022-01-21 VITALS — BP 118/76 | Ht 63.0 in | Wt 166.8 lb

## 2022-01-21 DIAGNOSIS — M25511 Pain in right shoulder: Secondary | ICD-10-CM | POA: Diagnosis not present

## 2022-01-21 NOTE — Patient Instructions (Addendum)
Thank you for coming in today.   Continue home exercise.   Recheck as needed.   We can do the injection every 3 months or so (October 10).

## 2022-01-23 ENCOUNTER — Ambulatory Visit: Payer: Medicare PPO | Admitting: Physical Therapy

## 2022-09-02 NOTE — Progress Notes (Unsigned)
Shirlyn Goltz, PhD, LAT, ATC acting as a scribe for Lynne Leader, MD.  Melissa Buchanan is a 72 y.o. female who presents to [place] at [facility] today for R knee pain. Pt was previously seen by Dr. Georgina Snell on 01/21/22 R shoulder pain.  Today, pt c/o R knee pain x 2 weeks, MOI unknown. Pt locates pain to peripatellar region and proximal to the patella. Mild swelling. Had some discomfort while doing childs pose.   R Knee swelling: mild Mechanical symptoms: no Radiates: denies Aggravates: flexion Treatments tried: BioFreeze and Tylenol with some relief.   Pertinent review of systems: No fevers or chills  Relevant historical information: History of a stress fracture.   Exam:  BP 110/70   Pulse 61   Ht 5\' 3"  (1.6 m)   Wt 176 lb 9.6 oz (80.1 kg)   SpO2 97%   BMI 31.28 kg/m  General: Well Developed, well nourished, and in no acute distress.   MSK: Right knee normal-appearing Normal motion. Tender palpation anterior and medial knee. Intact strength. Some laxity MCL stress test. Mildly positive McMurray's test.     Lab and Radiology Results  Procedure: Real-time Ultrasound Guided Injection of right knee joint anterior medial approach Device: Philips Affiniti 50G Images permanently stored and available for review in PACS Ultrasound evaluation prior to injection reveals mild joint effusion.  Hypoechoic fluid collecting in anterior medial Hoffa's fat pad.  Medial degeneration present also. Verbal informed consent obtained.  Discussed risks and benefits of procedure. Warned about infection, bleeding, hyperglycemia damage to structures among others. Patient expresses understanding and agreement Time-out conducted.   Noted no overlying erythema, induration, or other signs of local infection.   Skin prepped in a sterile fashion.   Local anesthesia: Topical Ethyl chloride.   With sterile technique and under real time ultrasound guidance: 40 mg of Kenalog and  2 mL Marcaine injected into intra-articular knee joint and into medial anterior Hoffa's fat pad.  Fluid seen entering the joint. Completed without difficulty   Pain immediately resolved suggesting accurate placement of the medication.   Advised to call if fevers/chills, erythema, induration, drainage, or persistent bleeding.   Images permanently stored and available for review in the ultrasound unit.  Impression: Technically successful ultrasound guided injection.    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  Mild patellofemoral DJD.  No acute fractures visible. Await formal radiology review   Assessment and Plan: 72 y.o. female with right knee pain thought to be due to exacerbation of DJD and Hoffa's fat pad impingement.  Plan for steroid injection today at anterior approach.  Recommend Voltaren gel.  Consider physical therapy.  Recheck in about 6 weeks.   PDMP not reviewed this encounter. Orders Placed This Encounter  Procedures   [place] LIMITED JOINT SPACE STRUCTURES LOW RIGHT(NO LINKED CHARGES)    Order Specific Question:   Reason for Exam (SYMPTOM  OR DIAGNOSIS REQUIRED)    Answer:   right knee pain    Order Specific Question:   Preferred imaging location?    Answer:   [facility]   DG Knee AP/LAT W/[place] Right    Standing Status:   Future    Number of Occurrences:   1    Standing Expiration Date:   10/03/2022    Order Specific Question:   Reason for Exam (SYMPTOM  OR DIAGNOSIS REQUIRED)    Answer:   right knee  pain    Order Specific Question:   Preferred imaging location?    Answer:   [facility]   No orders of the defined types were placed in this encounter.    Discussed warning signs or symptoms. Please see discharge instructions. Patient expresses understanding.   The above documentation has been reviewed and is accurate and complete Lynne Leader,  M.D.

## 2022-09-03 ENCOUNTER — Other Ambulatory Visit: Payer: Self-pay

## 2022-09-03 ENCOUNTER — Ambulatory Visit: Payer: Medicare PPO | Admitting: Family Medicine

## 2022-09-03 ENCOUNTER — Ambulatory Visit (INDEPENDENT_AMBULATORY_CARE_PROVIDER_SITE_OTHER): Payer: Medicare PPO

## 2022-09-03 VITALS — BP 110/70 | HR 61 | Ht 63.0 in | Wt 176.6 lb

## 2022-09-03 DIAGNOSIS — M25561 Pain in right knee: Secondary | ICD-10-CM | POA: Diagnosis not present

## 2022-09-03 NOTE — Patient Instructions (Signed)
Thank you for coming in today.   Please get an Xray today before you leave   You received an injection today. Seek immediate medical attention if the joint becomes red, extremely painful, or is oozing fluid.   Please use Voltaren gel (Generic Diclofenac Gel) up to 4x daily for pain as needed.  This is available over-the-counter as both the name brand Voltaren gel and the generic diclofenac gel.   Let me know if you would like to do physical therapy  Check back in 6 weeks

## 2022-09-05 NOTE — Progress Notes (Signed)
Right knee x-ray shows a little bit of arthritis changes.

## 2022-10-15 ENCOUNTER — Ambulatory Visit: Payer: Medicare PPO | Admitting: Family Medicine

## 2023-01-23 NOTE — Progress Notes (Signed)
I, Stevenson Clinch, CMA acting as a scribe for Clementeen Graham, MD.  Melissa Buchanan is a 72 y.o. female who presents to [facility] at [facility] today for re-occurring R shoulder pain. Pt was last seen by Dr. Denyse Amass on 01/21/22 and was advised to cont HEP as taught previously by PT. Last R subdeltoid steroid injection was on 12/10/21.  Today, pt reports worsening pain over the past 2 weeks. Pain at lateral aspect. Painful ROM. Gets stuck when reaching back. Occasional sharp shooting pain. Denies n/t. Notes decreased grip strength. Some weakness in the arm, notice while lifting weights. Some neck pain. Has tried BioFreeze and Tylenol with short-term relief. Tylenol causing GI upset.   Today, pt also c/o right knee pain x 1 month. Locates pain to medial aspect of the knee. Swelling present initially but seems to have improved since onset. Pain when descending stairs. Has tried Tylenol and compression.  She is interested in some increased home exercise planning to help continue to improve her knee pain.  Dx imaging: 12/10/21 R shoulder XR   Pertinent review of systems: No fevers or chills  Relevant historical information: History of right shoulder pain last year and right knee pain. 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  Exam:  BP 114/76   Pulse 76   Ht 5\' 3"  (1.6 m)   Wt 178 lb (80.7 kg)   SpO2 97%   BMI 31.53 kg/m  General: Well Developed, well nourished, and in no acute distress.   MSK: Right shoulder normal-appearing normal motion pain with abduction. Strength reduced abduction 4/5 otherwise intact.  Positive Hawkins and Neer's test.  Right knee normal-appearing nontender normal motion intact strength.   Lab and Radiology Results  Procedure: Real-time Ultrasound Guided Injection of right shoulder subacromial bursa Device: Philips Affiniti 50G/GE Logiq Images permanently stored and available for review in PACS Verbal informed consent obtained.   Discussed risks and benefits of procedure. Warned about infection, bleeding, hyperglycemia damage to structures among others. Patient expresses understanding and agreement Time-out conducted.   Noted no overlying erythema, induration, or other signs of local infection.   Skin prepped in a sterile fashion.   Local anesthesia: Topical Ethyl chloride.   With sterile technique and under real time ultrasound guidance: 40 mg of Kenalog and 2 mL of Marcaine injected into subacromial bursa. Fluid seen entering the bursa.   Completed without difficulty   Pain immediately resolved suggesting accurate placement of the medication.   Advised to call if fevers/chills, erythema, induration, drainage, or persistent bleeding.   Images permanently stored and available for review in the ultrasound unit.  Impression: Technically successful ultrasound guided injection.        Assessment and Plan: 72 y.o. female with right shoulder pain.  This is an acute exacerbation of a chronic problem.  She was seen for this issue about 13 months ago and did well with a subacromial injection.  Plan to repeat injection today.  If not improving consider glenohumeral injection especially as she has had frozen shoulder on the other side in the past.  Her intact range of motion makes frozen shoulder less likely.  Right knee pain: This is a chronic problem as well.  She had a knee injection about 5 months ago.  She is done pretty well and is looking for some pointers on how to do home exercises.  We talked about quad strengthening home exercise ideas that should not hurt her knee.  She will  check back as needed.   PDMP not reviewed this encounter. Orders Placed This Encounter  Procedures   [place] LIMITED JOINT SPACE STRUCTURES UP RIGHT(NO LINKED CHARGES)    Order Specific Question:   Reason for Exam (SYMPTOM  OR DIAGNOSIS REQUIRED)    Answer:   right shoulder pain    Order Specific Question:   Preferred imaging location?     Answer:   [facility]   No orders of the defined types were placed in this encounter.    Discussed warning signs or symptoms. Please see discharge instructions. Patient expresses understanding.   The above documentation has been reviewed and is accurate and complete Clementeen Graham, M.D.

## 2023-01-27 ENCOUNTER — Encounter: Payer: Self-pay | Admitting: Family Medicine

## 2023-01-27 ENCOUNTER — Other Ambulatory Visit: Payer: Self-pay

## 2023-01-27 ENCOUNTER — Ambulatory Visit: Payer: Medicare PPO | Admitting: Family Medicine

## 2023-01-27 VITALS — BP 114/76 | HR 76 | Ht 63.0 in | Wt 178.0 lb

## 2023-01-27 DIAGNOSIS — M25511 Pain in right shoulder: Secondary | ICD-10-CM

## 2023-01-27 DIAGNOSIS — G8929 Other chronic pain: Secondary | ICD-10-CM | POA: Diagnosis not present

## 2023-01-27 DIAGNOSIS — M25561 Pain in right knee: Secondary | ICD-10-CM

## 2023-01-27 NOTE — Patient Instructions (Signed)
Thank you for coming in today.   You received an injection today. Seek immediate medical attention if the joint becomes red, extremely painful, or is oozing fluid.   Check back as needed 

## 2023-05-06 ENCOUNTER — Other Ambulatory Visit: Payer: Self-pay

## 2023-05-06 DIAGNOSIS — G8929 Other chronic pain: Secondary | ICD-10-CM

## 2023-05-07 NOTE — Therapy (Signed)
OUTPATIENT PHYSICAL THERAPY LOWER EXTREMITY EVALUATION   Patient Name: Melissa Buchanan MRN: [id number] DOB:[birth year], 72 y.o., female Today's Date: 05/08/2023  END OF SESSION:  PT End of Session - 05/08/23 0931     Visit Number 1    Date for PT Re-Evaluation 06/26/23    Authorization Type [employer]    PT Start Time 0930    PT Stop Time 1015    PT Time Calculation (min) 45 min    Activity Tolerance Patient tolerated treatment well    Behavior During Therapy WFL for tasks assessed/performed             Past Medical History:  Diagnosis Date   Chicken pox    72 years old   Fibroids    H/O   Migraines    72 years old   No pertinent past medical history    PMB (postmenopausal bleeding)    H/O   Seasonal allergies    Past Surgical History:  Procedure Laterality Date   BUNIONECTOMY  02/25/2012   ENDOMETRIAL BIOPSY     HYSTEROSCOPY  11/12   WITH RESECTION D & C   MYOMECTOMY     WISDOM TOOTH EXTRACTION     Patient Active Problem List   Diagnosis Date Noted   Stress fracture of lower leg 06/11/2017   Chest pain 11/13/2014   Routine general medical examination at a health care facility 05/09/2013   Need for prophylactic vaccination with combined diphtheria-tetanus-pertussis (DTP) vaccine 05/09/2013   Need for prophylactic vaccination and inoculation against other viral diseases(V04.89) 05/09/2013    PCP: Birdena Jubilee  REFERRING PROVIDER: Clementeen Graham  REFERRING DIAG:  [phone] (ICD-10-CM) - Chronic pain of right knee    THERAPY DIAG:  Right knee pain, unspecified chronicity  Muscle weakness (generalized)  Stiffness of right knee, not elsewhere classified  Difficulty navigating stairs  Rationale for Evaluation and Treatment: Rehabilitation  ONSET DATE: 01/27/23  SUBJECTIVE:   SUBJECTIVE STATEMENT: The pain is just when I go down stairs. It is just the right. I am still working out every day except Friday and Sunday.   PERTINENT HISTORY: Today, pt  also c/o right knee pain x 1 month. Locates pain to medial aspect of the knee. Swelling present initially but seems to have improved since onset. Pain when descending stairs. Has tried Tylenol and compression.  She is interested in some increased home exercise planning to help continue to improve her knee pain.  PAIN:  Are you having pain? No  PRECAUTIONS: None  RED FLAGS: None   WEIGHT BEARING RESTRICTIONS: No  FALLS:  Has patient fallen in last 6 months? No  LIVING ENVIRONMENT: Lives with: lives with their family Lives in: House/apartment Stairs: Yes: Internal: 15 steps; can reach both and External: 3 steps; none Has following equipment at home: None  OCCUPATION: Retired  PLOF: Independent  PATIENT GOALS: find out why my knee is not moving right   NEXT MD VISIT: Don't have one   OBJECTIVE:  Note: Objective measures were completed at Evaluation unless otherwise noted.  DIAGNOSTIC FINDINGS: N/A for knee  PATIENT SURVEYS:  FOTO 55  COGNITION: Overall cognitive status: Within functional limits for tasks assessed     SENSATION: WFL   POSTURE: No Significant postural limitations  PALPATION: No TTP  LOWER EXTREMITY ROM: WNL    LOWER EXTREMITY MMT: 5/5   LOWER EXTREMITY SPECIAL TESTS:  Knee special tests: Thessaly test: negative  FUNCTIONAL TESTS:  5 times sit to stand: 8.41s  GAIT:  Distance walked: in clinic distances Assistive device utilized: None Level of assistance: Complete Independence Comments: slight antalgic gait due to guarding her R knee.    TODAY'S TREATMENT:                                                                                                                              DATE: EVAL 05/08/23    PATIENT EDUCATION:  Education details: POC and HEP Person educated: Patient Education method: Explanation Education comprehension: verbalized understanding  HOME EXERCISE PROGRAM: Access Code: [email or web address] URL:  [email or web address] Date: 05/08/2023 Prepared by: Cassie Freer  Exercises - Side Stepping with Resistance at Ankles  - 1 x daily - 7 x weekly - 3 sets - 10 reps - Step Up  - 1 x daily - 7 x weekly - 3 sets - 10 reps - Lateral Step Up  - 1 x daily - 7 x weekly - 3 sets - 10 reps - Forward Step Down  - 1 x daily - 7 x weekly - 3 sets - 10 reps - Lateral Step Down  - 1 x daily - 7 x weekly - 3 sets - 10 reps  ASSESSMENT:  CLINICAL IMPRESSION: Patient is a 72 y.o. female who was seen today for physical therapy evaluation and treatment for R knee pain. She reports it is not really painful but she feels that her R knee does not straighten out when she does down stairs. She reports pain is only present with stair navigation. Patient presents with good overall strength and ROM. She would like to come a few visits to work on some R knee strengthening before being able to continue independently.    OBJECTIVE IMPAIRMENTS: Abnormal gait, decreased balance, and pain.   ACTIVITY LIMITATIONS: stairs  REHAB POTENTIAL: Excellent  CLINICAL DECISION MAKING: Stable/uncomplicated  EVALUATION COMPLEXITY: Low   GOALS: Goals reviewed with patient? Yes  SHORT TERM GOALS: 05/29/23  Patient will be independent with initial HEP. Baseline: given 05/08/23 Goal status: INITIAL  LONG TERM GOALS: Target date: 06/26/23  Patient will be independent with advanced/ongoing HEP to improve outcomes and carryover.  Goal status: INITIAL  2. Patient will be able to ascend/descend stairs without difficulty and pain.  Baseline: feels weak and cannot control movement on R knee  Goal status: INITIAL  3.  Patient will be able to ambulate 53ft without a slight limp in her gait  Baseline: antalgic due to guarding R knee Goal status: INITIAL   PLAN:  PT FREQUENCY: 1x/week  PT DURATION: 6 weeks  PLANNED INTERVENTIONS: 97110-Therapeutic exercises, 97530- Therapeutic activity, 97112- Neuromuscular  re-education, 97535- Self Care, 16109- Manual therapy, [id number]- Gait training, Patient/Family education, Balance training, Stair training, Taping, Cryotherapy, and Moist heat  PLAN FOR NEXT SESSION: step up variations, anterior and lateral step downs, R knee strengthening    Cassie Freer, PT 05/08/2023, 10:09 AM

## 2023-05-08 ENCOUNTER — Ambulatory Visit: Payer: Medicare PPO | Attending: Family Medicine

## 2023-05-08 DIAGNOSIS — M25661 Stiffness of right knee, not elsewhere classified: Secondary | ICD-10-CM | POA: Diagnosis present

## 2023-05-08 DIAGNOSIS — G8929 Other chronic pain: Secondary | ICD-10-CM | POA: Diagnosis not present

## 2023-05-08 DIAGNOSIS — M25561 Pain in right knee: Secondary | ICD-10-CM | POA: Diagnosis present

## 2023-05-08 DIAGNOSIS — Z789 Other specified health status: Secondary | ICD-10-CM | POA: Insufficient documentation

## 2023-05-08 DIAGNOSIS — M6281 Muscle weakness (generalized): Secondary | ICD-10-CM | POA: Insufficient documentation

## 2023-05-15 ENCOUNTER — Encounter: Payer: Self-pay | Admitting: Physical Therapy

## 2023-05-15 ENCOUNTER — Ambulatory Visit: Payer: Medicare PPO | Admitting: Physical Therapy

## 2023-05-15 DIAGNOSIS — M25561 Pain in right knee: Secondary | ICD-10-CM | POA: Diagnosis not present

## 2023-05-15 DIAGNOSIS — M6281 Muscle weakness (generalized): Secondary | ICD-10-CM

## 2023-05-15 DIAGNOSIS — Z789 Other specified health status: Secondary | ICD-10-CM

## 2023-05-15 DIAGNOSIS — M25661 Stiffness of right knee, not elsewhere classified: Secondary | ICD-10-CM

## 2023-05-15 NOTE — Therapy (Signed)
OUTPATIENT PHYSICAL THERAPY LOWER EXTREMITY TREATMENT   Patient Name: Melissa Buchanan MRN: [id number] DOB:[birth year], 72 y.o., female Today's Date: 05/15/2023  END OF SESSION:  PT End of Session - 05/15/23 0804     Visit Number 2    Date for PT Re-Evaluation 06/26/23    PT Start Time 0804    PT Stop Time 0845    PT Time Calculation (min) 41 min    Activity Tolerance Patient tolerated treatment well    Behavior During Therapy WFL for tasks assessed/performed             Past Medical History:  Diagnosis Date   Chicken pox    72 years old   Fibroids    H/O   Migraines    72 years old   No pertinent past medical history    PMB (postmenopausal bleeding)    H/O   Seasonal allergies    Past Surgical History:  Procedure Laterality Date   BUNIONECTOMY  02/25/2012   ENDOMETRIAL BIOPSY     HYSTEROSCOPY  11/12   WITH RESECTION D & C   MYOMECTOMY     WISDOM TOOTH EXTRACTION     Patient Active Problem List   Diagnosis Date Noted   Stress fracture of lower leg 06/11/2017   Chest pain 11/13/2014   Routine general medical examination at a health care facility 05/09/2013   Need for prophylactic vaccination with combined diphtheria-tetanus-pertussis (DTP) vaccine 05/09/2013   Need for prophylactic vaccination and inoculation against other viral diseases(V04.89) 05/09/2013    PCP: Birdena Jubilee  REFERRING PROVIDER: Clementeen Graham  REFERRING DIAG:  [phone] (ICD-10-CM) - Chronic pain of right knee    THERAPY DIAG:  Right knee pain, unspecified chronicity  Muscle weakness (generalized)  Stiffness of right knee, not elsewhere classified  Difficulty navigating stairs  Rationale for Evaluation and Treatment: Rehabilitation  ONSET DATE: 01/27/23  SUBJECTIVE:   SUBJECTIVE STATEMENT: Hurts when she goes down the steps. Woke up with a catch in it today  PERTINENT HISTORY: Today, pt also c/o right knee pain x 1 month. Locates pain to medial aspect of the knee.  Swelling present initially but seems to have improved since onset. Pain when descending stairs. Has tried Tylenol and compression.  She is interested in some increased home exercise planning to help continue to improve her knee pain.  PAIN:  Are you having pain? No  PRECAUTIONS: None  RED FLAGS: None   WEIGHT BEARING RESTRICTIONS: No  FALLS:  Has patient fallen in last 6 months? No  LIVING ENVIRONMENT: Lives with: lives with their family Lives in: House/apartment Stairs: Yes: Internal: 15 steps; can reach both and External: 3 steps; none Has following equipment at home: None  OCCUPATION: Retired  PLOF: Independent  PATIENT GOALS: find out why my knee is not moving right   NEXT MD VISIT: Don't have one   OBJECTIVE:  Note: Objective measures were completed at Evaluation unless otherwise noted.  DIAGNOSTIC FINDINGS: N/A for knee  PATIENT SURVEYS:  FOTO 55  COGNITION: Overall cognitive status: Within functional limits for tasks assessed     SENSATION: WFL   POSTURE: No Significant postural limitations  PALPATION: No TTP  LOWER EXTREMITY ROM: WNL    LOWER EXTREMITY MMT: 5/5   LOWER EXTREMITY SPECIAL TESTS:  Knee special tests: Thessaly test: negative  FUNCTIONAL TESTS:  5 times sit to stand: 8.41s  GAIT: Distance walked: in clinic distances Assistive device utilized: None Level of assistance: Complete Independence Comments: slight antalgic  gait due to guarding her R knee.    TODAY'S TREATMENT:                                                                                                                              DATE:  05/15/23 NuStep L5 x 5 min 4in step ups x10 6in step ups x 10  LAQ RLE 3lb 2x10 HS curls blue 2x15 S2S holding yellow ball 2x10 Leg press 40lb 2x12 Shuffle shuffle squat squat 10 45sec plank Standing march 5lb over head x175 total  EVAL 05/08/23    PATIENT EDUCATION:  Education details: POC and HEP Person educated:  Patient Education method: Explanation Education comprehension: verbalized understanding  HOME EXERCISE PROGRAM: Access Code: [email or web address] URL: [email or web address] Date: 05/08/2023 Prepared by: Cassie Freer  Exercises - Side Stepping with Resistance at Ankles  - 1 x daily - 7 x weekly - 3 sets - 10 reps - Step Up  - 1 x daily - 7 x weekly - 3 sets - 10 reps - Lateral Step Up  - 1 x daily - 7 x weekly - 3 sets - 10 reps - Forward Step Down  - 1 x daily - 7 x weekly - 3 sets - 10 reps - Lateral Step Down  - 1 x daily - 7 x weekly - 3 sets - 10 reps  ASSESSMENT:  CLINICAL IMPRESSION: Patient is a 72 y.o. female who was seen today for physical therapy treatment for R knee pain. She  enters with a slight limp reporting a catch in her knee. This limp seemed to resolve on its on as session progressed. Pt reports going to the gym ans working out with a trainer. No pain reported during session. Cue needed for pacing not to complete interventions too fast. Cues for full ROM needed with HS curls and LAQ. Good effort given throughout session.   OBJECTIVE IMPAIRMENTS: Abnormal gait, decreased balance, and pain.   ACTIVITY LIMITATIONS: stairs  REHAB POTENTIAL: Excellent  CLINICAL DECISION MAKING: Stable/uncomplicated  EVALUATION COMPLEXITY: Low   GOALS: Goals reviewed with patient? Yes  SHORT TERM GOALS: 05/29/23  Patient will be independent with initial HEP. Baseline: given 05/08/23 Goal status: INITIAL  LONG TERM GOALS: Target date: 06/26/23  Patient will be independent with advanced/ongoing HEP to improve outcomes and carryover.  Goal status: INITIAL  2. Patient will be able to ascend/descend stairs without difficulty and pain.  Baseline: feels weak and cannot control movement on R knee  Goal status: INITIAL  3.  Patient will be able to ambulate 542ft without a slight limp in her gait  Baseline: antalgic due to guarding R knee Goal status: INITIAL   PLAN:  PT  FREQUENCY: 1x/week  PT DURATION: 6 weeks  PLANNED INTERVENTIONS: 97110-Therapeutic exercises, 97530- Therapeutic activity, [id number]- Neuromuscular re-education, 97535- Self Care, [id number]- Manual therapy, [phone]- Gait training, Patient/Family education, Balance training, Stair training, Taping, Cryotherapy, and Moist heat  PLAN FOR NEXT SESSION: step up  variations, anterior and lateral step downs, R knee strengthening    Grayce Sessions, PTA 05/15/2023, 8:05 AM

## 2023-05-22 ENCOUNTER — Ambulatory Visit: Payer: Medicare PPO | Admitting: Physical Therapy

## 2023-05-22 ENCOUNTER — Encounter: Payer: Self-pay | Admitting: Physical Therapy

## 2023-05-22 DIAGNOSIS — M25661 Stiffness of right knee, not elsewhere classified: Secondary | ICD-10-CM

## 2023-05-22 DIAGNOSIS — M25561 Pain in right knee: Secondary | ICD-10-CM

## 2023-05-22 DIAGNOSIS — M6281 Muscle weakness (generalized): Secondary | ICD-10-CM

## 2023-05-22 NOTE — Therapy (Signed)
OUTPATIENT PHYSICAL THERAPY LOWER EXTREMITY TREATMENT   Patient Name: Melissa Buchanan MRN: [id number] DOB:1950-06-06, 72 y.o., female Today's Date: 05/22/2023  END OF SESSION:  PT End of Session - 05/22/23 1604     Visit Number 3    Date for PT Re-Evaluation 06/26/23    PT Start Time 1604    PT Stop Time 1645    PT Time Calculation (min) 41 min    Activity Tolerance Patient tolerated treatment well    Behavior During Therapy WFL for tasks assessed/performed             Past Medical History:  Diagnosis Date   Chicken pox    72 years old   Fibroids    H/O   Migraines    72 years old   No pertinent past medical history    PMB (postmenopausal bleeding)    H/O   Seasonal allergies    Past Surgical History:  Procedure Laterality Date   BUNIONECTOMY  02/25/2012   ENDOMETRIAL BIOPSY     HYSTEROSCOPY  11/12   WITH RESECTION D & C   MYOMECTOMY     WISDOM TOOTH EXTRACTION     Patient Active Problem List   Diagnosis Date Noted   Stress fracture of lower leg 06/11/2017   Chest pain 11/13/2014   Routine general medical examination at a health care facility 05/09/2013   Need for prophylactic vaccination with combined diphtheria-tetanus-pertussis (DTP) vaccine 05/09/2013   Need for prophylactic vaccination and inoculation against other viral diseases(V04.89) 05/09/2013    PCP: Birdena Jubilee  REFERRING PROVIDER: Clementeen Graham  REFERRING DIAG:  [phone] (ICD-10-CM) - Chronic pain of right knee    THERAPY DIAG:  Right knee pain, unspecified chronicity  Stiffness of right knee, not elsewhere classified  Muscle weakness (generalized)  Rationale for Evaluation and Treatment: Rehabilitation  ONSET DATE: 01/27/23  SUBJECTIVE:   SUBJECTIVE STATEMENT: "Im good"  PERTINENT HISTORY: Today, pt also c/o right knee pain x 1 month. Locates pain to medial aspect of the knee. Swelling present initially but seems to have improved since onset. Pain when descending stairs.  Has tried Tylenol and compression.  She is interested in some increased home exercise planning to help continue to improve her knee pain.  PAIN:  Are you having pain? No  PRECAUTIONS: None  RED FLAGS: None   WEIGHT BEARING RESTRICTIONS: No  FALLS:  Has patient fallen in last 6 months? No  LIVING ENVIRONMENT: Lives with: lives with their family Lives in: House/apartment Stairs: Yes: Internal: 15 steps; can reach both and External: 3 steps; none Has following equipment at home: None  OCCUPATION: Retired  PLOF: Independent  PATIENT GOALS: find out why my knee is not moving right   NEXT MD VISIT: Don't have one   OBJECTIVE:  Note: Objective measures were completed at Evaluation unless otherwise noted.  DIAGNOSTIC FINDINGS: N/A for knee  PATIENT SURVEYS:  FOTO 22  COGNITION: Overall cognitive status: Within functional limits for tasks assessed     SENSATION: WFL   POSTURE: No Significant postural limitations  PALPATION: No TTP  LOWER EXTREMITY ROM: WNL    LOWER EXTREMITY MMT: 5/5   LOWER EXTREMITY SPECIAL TESTS:  Knee special tests: Thessaly test: negative  FUNCTIONAL TESTS:  5 times sit to stand: 8.41s  GAIT: Distance walked: in clinic distances Assistive device utilized: None Level of assistance: Complete Independence Comments: slight antalgic gait due to guarding her R knee.    TODAY'S TREATMENT:  DATE:  05/22/23 NuStep L5 x 6 min S2S holding yellow ball 2x10 HS curls 25lb 3x10 Leg Ext 15lb 3x10 Forward & Lateral step ups 6in x 10 each Heel raises 2x10 Resisted gait 30lb 4 way x 3 each  05/15/23 NuStep L5 x 5 min 4in step ups x10 6in step ups x 10  LAQ RLE 3lb 2x10 HS curls blue 2x15 S2S holding yellow ball 2x10 Leg press 40lb 2x12 Shuffle shuffle squat squat 10 45sec plank Standing march 5lb over head x175  total  EVAL 05/08/23    PATIENT EDUCATION:  Education details: POC and HEP Person educated: Patient Education method: Explanation Education comprehension: verbalized understanding  HOME EXERCISE PROGRAM: Access Code: [email or web address] URL: [email or web address] Date: 05/08/2023 Prepared by: Cassie Freer  Exercises - Side Stepping with Resistance at Ankles  - 1 x daily - 7 x weekly - 3 sets - 10 reps - Step Up  - 1 x daily - 7 x weekly - 3 sets - 10 reps - Lateral Step Up  - 1 x daily - 7 x weekly - 3 sets - 10 reps - Forward Step Down  - 1 x daily - 7 x weekly - 3 sets - 10 reps - Lateral Step Down  - 1 x daily - 7 x weekly - 3 sets - 10 reps  ASSESSMENT:  CLINICAL IMPRESSION: Patient is a 72 y.o. female who was seen today for physical therapy treatment for R knee pain. She  enters doing well only reporting some soreness.  No pain reported during session. Cue for eccentric control needed with curls and extensions. Some weakness reported with resisted gait. Cues for LE placement needed with step ups. Good effort given throughout session.   OBJECTIVE IMPAIRMENTS: Abnormal gait, decreased balance, and pain.   ACTIVITY LIMITATIONS: stairs  REHAB POTENTIAL: Excellent  CLINICAL DECISION MAKING: Stable/uncomplicated  EVALUATION COMPLEXITY: Low   GOALS: Goals reviewed with patient? Yes  SHORT TERM GOALS: 05/29/23  Patient will be independent with initial HEP. Baseline: given 05/08/23 Goal status: Ongoing 05/22/23, but she does attend an exercises class   LONG TERM GOALS: Target date: 06/26/23  Patient will be independent with advanced/ongoing HEP to improve outcomes and carryover.  Goal status: INITIAL  2. Patient will be able to ascend/descend stairs without difficulty and pain.  Baseline: feels weak and cannot control movement on R knee  Goal status: On going 05/22/23  3.  Patient will be able to ambulate 518ft without a slight limp in her gait  Baseline:  antalgic due to guarding R knee Goal status: INITIAL   PLAN:  PT FREQUENCY: 1x/week  PT DURATION: 6 weeks  PLANNED INTERVENTIONS: 97110-Therapeutic exercises, 97530- Therapeutic activity, 97112- Neuromuscular re-education, 97535- Self Care, [id number]- Manual therapy, [phone]- Gait training, Patient/Family education, Balance training, Stair training, Taping, Cryotherapy, and Moist heat  PLAN FOR NEXT SESSION: step up variations, anterior and lateral step downs, R knee strengthening    Grayce Sessions, PTA 05/22/2023, 4:04 PM

## 2023-05-27 ENCOUNTER — Ambulatory Visit: Payer: Medicare PPO | Admitting: Physical Therapy

## 2023-05-27 DIAGNOSIS — M25561 Pain in right knee: Secondary | ICD-10-CM | POA: Diagnosis not present

## 2023-05-27 DIAGNOSIS — M25661 Stiffness of right knee, not elsewhere classified: Secondary | ICD-10-CM

## 2023-05-27 DIAGNOSIS — M6281 Muscle weakness (generalized): Secondary | ICD-10-CM

## 2023-05-27 NOTE — Therapy (Signed)
OUTPATIENT PHYSICAL THERAPY LOWER EXTREMITY TREATMENT   Patient Name: Melissa Buchanan MRN: [id number] DOB:04-23-1951, 72 y.o., female Today's Date: 05/27/2023  END OF SESSION:  PT End of Session - 05/27/23 0802     Visit Number 4    Date for PT Re-Evaluation 06/26/23    Authorization Type [employer]    PT Start Time 0803    PT Stop Time 0841    PT Time Calculation (min) 38 min    Activity Tolerance Patient tolerated treatment well    Behavior During Therapy WFL for tasks assessed/performed              Past Medical History:  Diagnosis Date   Chicken pox    72 years old   Fibroids    H/O   Migraines    72 years old   No pertinent past medical history    PMB (postmenopausal bleeding)    H/O   Seasonal allergies    Past Surgical History:  Procedure Laterality Date   BUNIONECTOMY  02/25/2012   ENDOMETRIAL BIOPSY     HYSTEROSCOPY  11/12   WITH RESECTION D & C   MYOMECTOMY     WISDOM TOOTH EXTRACTION     Patient Active Problem List   Diagnosis Date Noted   Stress fracture of lower leg 06/11/2017   Chest pain 11/13/2014   Routine general medical examination at a health care facility 05/09/2013   Need for prophylactic vaccination with combined diphtheria-tetanus-pertussis (DTP) vaccine 05/09/2013   Need for prophylactic vaccination and inoculation against other viral diseases(V04.89) 05/09/2013    PCP: Birdena Jubilee  REFERRING PROVIDER: Clementeen Graham  REFERRING DIAG:  [phone] (ICD-10-CM) - Chronic pain of right knee    THERAPY DIAG:  No diagnosis found.  Rationale for Evaluation and Treatment: Rehabilitation  ONSET DATE: 01/27/23  SUBJECTIVE:   SUBJECTIVE STATEMENT: "Cold days like this, my knee aches." Pt states she has exercised some but "not like I'm supposed to." Reports she exercises outside of therapy.   PERTINENT HISTORY: Today, pt also c/o right knee pain x 1 month. Locates pain to medial aspect of the knee. Swelling present initially but  seems to have improved since onset. Pain when descending stairs. Has tried Tylenol and compression.  She is interested in some increased home exercise planning to help continue to improve her knee pain.  PAIN:  Are you having pain? Yes, aching but pt does not quantify  PRECAUTIONS: None  RED FLAGS: None   WEIGHT BEARING RESTRICTIONS: No  FALLS:  Has patient fallen in last 6 months? No  LIVING ENVIRONMENT: Lives with: lives with their family Lives in: House/apartment Stairs: Yes: Internal: 15 steps; can reach both and External: 3 steps; none Has following equipment at home: None  OCCUPATION: Retired  PLOF: Independent  PATIENT GOALS: find out why my knee is not moving right   NEXT MD VISIT: Don't have one   OBJECTIVE:  Note: Objective measures were completed at Evaluation unless otherwise noted.  DIAGNOSTIC FINDINGS: N/A for knee  PATIENT SURVEYS:  FOTO 70   PALPATION: No TTP  LOWER EXTREMITY ROM: WNL    LOWER EXTREMITY MMT: 5/5   LOWER EXTREMITY SPECIAL TESTS:  Knee special tests: Thessaly test: negative  FUNCTIONAL TESTS:  5 times sit to stand: 8.41s  GAIT: Distance walked: in clinic distances Assistive device utilized: None Level of assistance: Complete Independence Comments: slight antalgic gait due to guarding her R knee.    TODAY'S TREATMENT:  DATE:  05/27/23 Recumbent bike L2 x 5 min Leg press double leg 40# x10 Leg press eccentrics on right x10 at 40# Leg press single leg 30# x10 Side step red TB 2x10 Backwards monster walk red TB 2x10 Wall slide red TB around knees 2x10x5" Primal push up 2x30" Quadruped Fire hydrant red TB 2x10  05/22/23 NuStep L5 x 6 min S2S holding yellow ball 2x10 HS curls 25lb 3x10 Leg Ext 15lb 3x10 Forward & Lateral step ups 6in x 10 each Heel raises 2x10 Resisted gait 30lb 4 way x 3  each  05/15/23 NuStep L5 x 5 min 4in step ups x10 6in step ups x 10  LAQ RLE 3lb 2x10 HS curls blue 2x15 S2S holding yellow ball 2x10 Leg press 40lb 2x12 Shuffle shuffle squat squat 10 45sec plank Standing march 5lb over head x175 total  EVAL 05/08/23    PATIENT EDUCATION:  Education details: Knee positioning with exercises Person educated: Patient Education method: Explanation Education comprehension: verbalized understanding  HOME EXERCISE PROGRAM: Access Code: [email or web address] URL: [email or web address] Date: 05/08/2023 Prepared by: Cassie Freer  Exercises - Side Stepping with Resistance at Ankles  - 1 x daily - 7 x weekly - 3 sets - 10 reps - Step Up  - 1 x daily - 7 x weekly - 3 sets - 10 reps - Lateral Step Up  - 1 x daily - 7 x weekly - 3 sets - 10 reps - Forward Step Down  - 1 x daily - 7 x weekly - 3 sets - 10 reps - Lateral Step Down  - 1 x daily - 7 x weekly - 3 sets - 10 reps  ASSESSMENT:  CLINICAL IMPRESSION: Patient is a 72 y.o. female who was seen today for physical therapy treatment for R knee pain. Session focused on reducing knee valgus during squats and exercises requiring knee flexion. Continued work on improving glute med strength -- pt compensates with hip IR most notable during quadruped fire hydrants and trunk lateral flexion during side stepping.    OBJECTIVE IMPAIRMENTS: Abnormal gait, decreased balance, and pain.    GOALS: Goals reviewed with patient? Yes  SHORT TERM GOALS: 05/29/23  Patient will be independent with initial HEP. Baseline: given 05/08/23 Goal status: Ongoing 05/22/23, but she does attend an exercises class   LONG TERM GOALS: Target date: 06/26/23  Patient will be independent with advanced/ongoing HEP to improve outcomes and carryover.  Goal status: INITIAL  2. Patient will be able to ascend/descend stairs without difficulty and pain.  Baseline: feels weak and cannot control movement on R knee  Goal status: On  going 05/22/23  3.  Patient will be able to ambulate 544ft without a slight limp in her gait  Baseline: antalgic due to guarding R knee Goal status: INITIAL   PLAN:  PT FREQUENCY: 1x/week  PT DURATION: 6 weeks  PLANNED INTERVENTIONS: 97110-Therapeutic exercises, 97530- Therapeutic activity, 97112- Neuromuscular re-education, 97535- Self Care, 16109- Manual therapy, [phone]- Gait training, Patient/Family education, Balance training, Stair training, Taping, Cryotherapy, and Moist heat  PLAN FOR NEXT SESSION: step up variations, anterior and lateral step downs, R knee strengthening    Beniah Magnan April Ma L Gianina Olinde, PT 05/27/2023, 8:03 AM

## 2023-05-29 NOTE — Therapy (Signed)
OUTPATIENT PHYSICAL THERAPY LOWER EXTREMITY TREATMENT   Patient Name: Melissa Buchanan MRN: [id number] DOB:09/16/1950, 72 y.o., female Today's Date: 06/02/2023  END OF SESSION:  PT End of Session - 06/02/23 0757     Visit Number 5    Date for PT Re-Evaluation 06/26/23    Authorization Type [employer]    PT Start Time 0758    PT Stop Time 0845    PT Time Calculation (min) 47 min    Activity Tolerance Patient tolerated treatment well    Behavior During Therapy WFL for tasks assessed/performed               Past Medical History:  Diagnosis Date   Chicken pox    72 years old   Fibroids    H/O   Migraines    72 years old   No pertinent past medical history    PMB (postmenopausal bleeding)    H/O   Seasonal allergies    Past Surgical History:  Procedure Laterality Date   BUNIONECTOMY  02/25/2012   ENDOMETRIAL BIOPSY     HYSTEROSCOPY  11/12   WITH RESECTION D & C   MYOMECTOMY     WISDOM TOOTH EXTRACTION     Patient Active Problem List   Diagnosis Date Noted   Stress fracture of lower leg 06/11/2017   Chest pain 11/13/2014   Routine general medical examination at a health care facility 05/09/2013   Need for prophylactic vaccination with combined diphtheria-tetanus-pertussis (DTP) vaccine 05/09/2013   Need for prophylactic vaccination and inoculation against other viral diseases(V04.89) 05/09/2013    PCP: Birdena Jubilee  REFERRING PROVIDER: Clementeen Graham  REFERRING DIAG:  [id number] (ICD-10-CM) - Chronic pain of right knee    THERAPY DIAG:  Right knee pain, unspecified chronicity  Stiffness of right knee, not elsewhere classified  Muscle weakness (generalized)  Difficulty navigating stairs  Rationale for Evaluation and Treatment: Rehabilitation  ONSET DATE: 01/27/23  SUBJECTIVE:   SUBJECTIVE STATEMENT: "Cold days like this, my knee aches." Pt states she has exercised some but "not like I'm supposed to." Reports she exercises outside of therapy.    PERTINENT HISTORY: Feeling alright, knees are sore. I have been resting it.   PAIN:  Are you having pain? Yes, aching but pt does not quantify  PRECAUTIONS: None  RED FLAGS: None   WEIGHT BEARING RESTRICTIONS: No  FALLS:  Has patient fallen in last 6 months? No  LIVING ENVIRONMENT: Lives with: lives with their family Lives in: House/apartment Stairs: Yes: Internal: 15 steps; can reach both and External: 3 steps; none Has following equipment at home: None  OCCUPATION: Retired  PLOF: Independent  PATIENT GOALS: find out why my knee is not moving right   NEXT MD VISIT: Don't have one   OBJECTIVE:  Note: Objective measures were completed at Evaluation unless otherwise noted.  DIAGNOSTIC FINDINGS: N/A for knee  PATIENT SURVEYS:  FOTO 70   PALPATION: No TTP  LOWER EXTREMITY ROM: WNL    LOWER EXTREMITY MMT: 5/5   LOWER EXTREMITY SPECIAL TESTS:  Knee special tests: Thessaly test: negative  FUNCTIONAL TESTS:  5 times sit to stand: 8.41s  GAIT: Distance walked: in clinic distances Assistive device utilized: None Level of assistance: Complete Independence Comments: slight antalgic gait due to guarding her R knee.    TODAY'S TREATMENT:  DATE:  05/29/23 NuStep L5 x37mins  Resisted gait 30# 4 way x4  Step ups 6"  Lateral step ups 6"  Calf stretch on slant 15s x2 Leg press 60# 2x10 Leg ext 15# 2x10 HS curls 35# 2x10 STS with 5# dumbbell 2x10  05/27/23 Recumbent bike L2 x 5 min Leg press double leg 40# x10 Leg press eccentrics on right x10 at 40# Leg press single leg 30# x10 Side step red TB 2x10 Backwards monster walk red TB 2x10 Wall slide red TB around knees 2x10x5" Primal push up 2x30" Quadruped Fire hydrant red TB 2x10  05/22/23 NuStep L5 x 6 min S2S holding yellow ball 2x10 HS curls 25lb 3x10 Leg Ext 15lb  3x10 Forward & Lateral step ups 6in x 10 each Heel raises 2x10 Resisted gait 30lb 4 way x 3 each  05/15/23 NuStep L5 x 5 min 4in step ups x10 6in step ups x 10  LAQ RLE 3lb 2x10 HS curls blue 2x15 S2S holding yellow ball 2x10 Leg press 40lb 2x12 Shuffle shuffle squat squat 10 45sec plank Standing march 5lb over head x175 total  EVAL 05/08/23    PATIENT EDUCATION:  Education details: Knee positioning with exercises Person educated: Patient Education method: Explanation Education comprehension: verbalized understanding  HOME EXERCISE PROGRAM: Access Code: [email or web address] URL: [email or web address] Date: 05/08/2023 Prepared by: Cassie Freer  Exercises - Side Stepping with Resistance at Ankles  - 1 x daily - 7 x weekly - 3 sets - 10 reps - Step Up  - 1 x daily - 7 x weekly - 3 sets - 10 reps - Lateral Step Up  - 1 x daily - 7 x weekly - 3 sets - 10 reps - Forward Step Down  - 1 x daily - 7 x weekly - 3 sets - 10 reps - Lateral Step Down  - 1 x daily - 7 x weekly - 3 sets - 10 reps  ASSESSMENT:  CLINICAL IMPRESSION: Patient is a 71 y.o. female who was seen today for physical therapy treatment for R knee pain. Session focused on mostly knee strengthening. She reports some pulling in her calf and hamstring, reports stretching feels good. Patient has some c/o pain with hamstring curls, cued to only pull to pain threshold.  Continue to work on improving overall knee strength and gait.    OBJECTIVE IMPAIRMENTS: Abnormal gait, decreased balance, and pain.    GOALS: Goals reviewed with patient? Yes  SHORT TERM GOALS: 05/29/23  Patient will be independent with initial HEP. Baseline: given 05/08/23 Goal status: Ongoing 05/22/23, but she does attend an exercises class   LONG TERM GOALS: Target date: 06/26/23  Patient will be independent with advanced/ongoing HEP to improve outcomes and carryover.  Goal status: INITIAL  2. Patient will be able to ascend/descend stairs  without difficulty and pain.  Baseline: feels weak and cannot control movement on R knee  Goal status: On going 05/22/23  3.  Patient will be able to ambulate 547ft without a slight limp in her gait  Baseline: antalgic due to guarding R knee Goal status: INITIAL   PLAN:  PT FREQUENCY: 1x/week  PT DURATION: 6 weeks  PLANNED INTERVENTIONS: 97110-Therapeutic exercises, 97530- Therapeutic activity, 97112- Neuromuscular re-education, 97535- Self Care, 16109- Manual therapy, [phone]- Gait training, Patient/Family education, Balance training, Stair training, Taping, Cryotherapy, and Moist heat  PLAN FOR NEXT SESSION: step up variations, anterior and lateral step downs, R knee strengthening    Cassie Freer, PT 06/02/2023, 8:44 AM

## 2023-06-02 ENCOUNTER — Ambulatory Visit: Payer: Medicare PPO

## 2023-06-02 DIAGNOSIS — Z789 Other specified health status: Secondary | ICD-10-CM

## 2023-06-02 DIAGNOSIS — M25561 Pain in right knee: Secondary | ICD-10-CM

## 2023-06-02 DIAGNOSIS — M6281 Muscle weakness (generalized): Secondary | ICD-10-CM

## 2023-06-02 DIAGNOSIS — M25661 Stiffness of right knee, not elsewhere classified: Secondary | ICD-10-CM

## 2023-06-06 NOTE — Therapy (Signed)
 OUTPATIENT PHYSICAL THERAPY LOWER EXTREMITY TREATMENT   Patient Name: Melissa Buchanan MRN: [id number] DOB:[birth year], 73 y.o., female Today's Date: 06/09/2023  END OF SESSION:  PT End of Session - 06/09/23 0805     Visit Number 6    Date for PT Re-Evaluation 06/26/23    Authorization Type [employer]    PT Start Time 0800    PT Stop Time 0845    PT Time Calculation (min) 45 min    Activity Tolerance Patient tolerated treatment well    Behavior During Therapy WFL for tasks assessed/performed                Past Medical History:  Diagnosis Date   Chicken pox    73 years old   Fibroids    H/O   Migraines    73 years old   No pertinent past medical history    PMB (postmenopausal bleeding)    H/O   Seasonal allergies    Past Surgical History:  Procedure Laterality Date   BUNIONECTOMY  02/25/2012   ENDOMETRIAL BIOPSY     HYSTEROSCOPY  11/12   WITH RESECTION D & C   MYOMECTOMY     WISDOM TOOTH EXTRACTION     Patient Active Problem List   Diagnosis Date Noted   Stress fracture of lower leg 06/11/2017   Chest pain 11/13/2014   Routine general medical examination at a health care facility 05/09/2013   Need for prophylactic vaccination with combined diphtheria-tetanus-pertussis (DTP) vaccine 05/09/2013   Need for prophylactic vaccination and inoculation against other viral diseases(V04.89) 05/09/2013    PCP: Catheryn Greenland  REFERRING PROVIDER: Joane Birmingham  REFERRING DIAG:  [phone] (ICD-10-CM) - Chronic pain of right knee    THERAPY DIAG:  Right knee pain, unspecified chronicity  Stiffness of right knee, not elsewhere classified  Muscle weakness (generalized)  Rationale for Evaluation and Treatment: Rehabilitation  ONSET DATE: 01/27/23  SUBJECTIVE:   SUBJECTIVE STATEMENT: I am doing alright but any time I have to bend my knee back or do a squat it hurts my right knee.   PERTINENT HISTORY: Feeling alright, knees are sore. I have been resting it.    PAIN:  Are you having pain? Yes, aching but pt does not quantify  PRECAUTIONS: None  RED FLAGS: None   WEIGHT BEARING RESTRICTIONS: No  FALLS:  Has patient fallen in last 6 months? No  LIVING ENVIRONMENT: Lives with: lives with their family Lives in: House/apartment Stairs: Yes: Internal: 15 steps; can reach both and External: 3 steps; none Has following equipment at home: None  OCCUPATION: Retired  PLOF: Independent  PATIENT GOALS: find out why my knee is not moving right   NEXT MD VISIT: Don't have one   OBJECTIVE:  Note: Objective measures were completed at Evaluation unless otherwise noted.  DIAGNOSTIC FINDINGS: N/A for knee  PATIENT SURVEYS:  FOTO 70   PALPATION: No TTP  LOWER EXTREMITY ROM: WNL    LOWER EXTREMITY MMT: 5/5   LOWER EXTREMITY SPECIAL TESTS:  Knee special tests: Thessaly test: negative  FUNCTIONAL TESTS:  5 times sit to stand: 8.41s  GAIT: Distance walked: in clinic distances Assistive device utilized: None Level of assistance: Complete Independence Comments: slight antalgic gait due to guarding her R knee.    TODAY'S TREATMENT:  DATE:  06/09/23 NuStep L5 x65mins LE only  Treadmill pushes 30s x2  Calf raises 2x12  5# hip abd and ext 2x10 Step ups 6  Lateral band walks Monster walks  STS on airex with OHP 2x10  05/29/23 NuStep L5 x60mins  Resisted gait 30# 4 way x4  Step ups 6  Lateral step ups 6  Calf stretch on slant 15s x2 Leg press 60# 2x10 Leg ext 15# 2x10 HS curls 35# 2x10 STS with 5# dumbbell 2x10  05/27/23 Recumbent bike L2 x 5 min Leg press double leg 40# x10 Leg press eccentrics on right x10 at 40# Leg press single leg 30# x10 Side step red TB 2x10 Backwards monster walk red TB 2x10 Wall slide red TB around knees 2x10x5 Primal push up 2x30 Quadruped Fire hydrant red TB  2x10  05/22/23 NuStep L5 x 6 min S2S holding yellow ball 2x10 HS curls 25lb 3x10 Leg Ext 15lb 3x10 Forward & Lateral step ups 6in x 10 each Heel raises 2x10 Resisted gait 30lb 4 way x 3 each  05/15/23 NuStep L5 x 5 min 4in step ups x10 6in step ups x 10  LAQ RLE 3lb 2x10 HS curls blue 2x15 S2S holding yellow ball 2x10 Leg press 40lb 2x12 Shuffle shuffle squat squat 10 45sec plank Standing march 5lb over head x175 total  EVAL 05/08/23    PATIENT EDUCATION:  Education details: Knee positioning with exercises Person educated: Patient Education method: Explanation Education comprehension: verbalized understanding  HOME EXERCISE PROGRAM: Access Code: [email or web address] URL: [email or web address] Date: 05/08/2023 Prepared by: Almetta Fam  Exercises - Side Stepping with Resistance at Ankles  - 1 x daily - 7 x weekly - 3 sets - 10 reps - Step Up  - 1 x daily - 7 x weekly - 3 sets - 10 reps - Lateral Step Up  - 1 x daily - 7 x weekly - 3 sets - 10 reps - Forward Step Down  - 1 x daily - 7 x weekly - 3 sets - 10 reps - Lateral Step Down  - 1 x daily - 7 x weekly - 3 sets - 10 reps  ASSESSMENT:  CLINICAL IMPRESSION: Patient is a 73 y.o. female who was seen today for physical therapy treatment for R knee pain. Session focused on mostly knee and hip strengthening. Reports she can feel her knee working with banded walks, STS, and hip abduction intervention. She has a low couch that she says is hard to get out of sometimes. Continue to work on improving overall knee strength and gait to be able to flex knee without pain.    OBJECTIVE IMPAIRMENTS: Abnormal gait, decreased balance, and pain.    GOALS: Goals reviewed with patient? Yes  SHORT TERM GOALS: 05/29/23  Patient will be independent with initial HEP. Baseline: given 05/08/23 Goal status: Ongoing 05/22/23, but she does attend an exercises class   LONG TERM GOALS: Target date: 06/26/23  Patient will be  independent with advanced/ongoing HEP to improve outcomes and carryover.  Goal status: INITIAL  2. Patient will be able to ascend/descend stairs without difficulty and pain.  Baseline: feels weak and cannot control movement on R knee  Goal status: On going 05/22/23  3.  Patient will be able to ambulate 568ft without a slight limp in her gait  Baseline: antalgic due to guarding R knee Goal status: INITIAL   PLAN:  PT FREQUENCY: 1x/week  PT DURATION: 6 weeks  PLANNED INTERVENTIONS: 97110-Therapeutic exercises, 97530- Therapeutic activity, 97112-  Neuromuscular re-education, [phone]- Self Care, 02859- Manual therapy, [phone]- Gait training, Patient/Family education, Balance training, Stair training, Taping, Cryotherapy, and Moist heat  PLAN FOR NEXT SESSION: step up variations, anterior and lateral step downs, R knee strengthening    Almetta Fam, PT 06/09/2023, 8:45 AM

## 2023-06-09 ENCOUNTER — Ambulatory Visit: Payer: Medicare PPO | Admitting: Family Medicine

## 2023-06-09 ENCOUNTER — Other Ambulatory Visit: Payer: Self-pay

## 2023-06-09 ENCOUNTER — Ambulatory Visit: Payer: Medicare PPO | Attending: Family Medicine

## 2023-06-09 VITALS — BP 128/82 | HR 80 | Ht 63.0 in | Wt 183.0 lb

## 2023-06-09 DIAGNOSIS — Z789 Other specified health status: Secondary | ICD-10-CM | POA: Insufficient documentation

## 2023-06-09 DIAGNOSIS — M1711 Unilateral primary osteoarthritis, right knee: Secondary | ICD-10-CM | POA: Diagnosis not present

## 2023-06-09 DIAGNOSIS — M25511 Pain in right shoulder: Secondary | ICD-10-CM | POA: Insufficient documentation

## 2023-06-09 DIAGNOSIS — M25561 Pain in right knee: Secondary | ICD-10-CM | POA: Diagnosis not present

## 2023-06-09 DIAGNOSIS — M25661 Stiffness of right knee, not elsewhere classified: Secondary | ICD-10-CM | POA: Insufficient documentation

## 2023-06-09 DIAGNOSIS — M6281 Muscle weakness (generalized): Secondary | ICD-10-CM | POA: Insufficient documentation

## 2023-06-09 DIAGNOSIS — M25611 Stiffness of right shoulder, not elsewhere classified: Secondary | ICD-10-CM | POA: Insufficient documentation

## 2023-06-09 DIAGNOSIS — G8929 Other chronic pain: Secondary | ICD-10-CM

## 2023-06-09 NOTE — Progress Notes (Signed)
 LILLETTE Ileana Collet, PhD, LAT, ATC acting as a scribe for Artist Lloyd, MD.  Melissa Buchanan is a 73 y.o. female who presents to [facility] today for R knee/leg pain. Pt was last seen by Dr. Lloyd on 01/27/23. Last R knee steroid injection; 09/03/22.  Today, pt reports R knee pain returning about a month ago.. She now is having pain into the distal hamstring and proximal calf as well as the knee joint. She c/o limited ability to flex her R knee.  Her knee feels very tight when she flexes it.  Dx imaging: 09/03/22 R knee XR  Pertinent review of systems: No fevers or chills  Relevant historical information: History of a stress fracture.   Exam:  BP 128/82   Pulse 80   Ht 5' 3 (1.6 m)   Wt 183 lb (83 kg)   SpO2 92%   BMI 32.42 kg/m  General: Well Developed, well nourished, and in no acute distress.   MSK: Right knee normal-appearing Normal motion. Nontender. Stable ligamentous exam. Intact strength.    Lab and Radiology Results  Procedure: Real-time Ultrasound Guided Injection of right knee joint superior lateral patella space Device: Philips Affiniti 50G/GE Logiq Images permanently stored and available for review in PACS Ultrasound evaluation prior to injection shows a moderate joint effusion at the superior patellar space and a small Baker's cyst. Verbal informed consent obtained.  Discussed risks and benefits of procedure. Warned about infection, bleeding, hyperglycemia damage to structures among others. Patient expresses understanding and agreement Time-out conducted.   Noted no overlying erythema, induration, or other signs of local infection.   Skin prepped in a sterile fashion.   Local anesthesia: Topical Ethyl chloride.   With sterile technique and under real time ultrasound guidance: 40 mg of Kenalog and 2 mL of Marcaine  injected into knee joint. Fluid seen entering the joint capsule.   Completed without difficulty   Pain immediately  resolved suggesting accurate placement of the medication.   Advised to call if fevers/chills, erythema, induration, drainage, or persistent bleeding.   Images permanently stored and available for review in the ultrasound unit.  Impression: Technically successful ultrasound guided injection.        Assessment and Plan: 73 y.o. female with chronic right knee pain due to exacerbation of DJD.  Part of her tightness is also due to the knee effusion.  She does have a Baker's cyst which I think is less contributory.  Plan for intra-articular steroid injection today.  Also will refer back to physical therapy where she is getting some benefit.  Check back as needed.   PDMP not reviewed this encounter. Orders Placed This Encounter  Procedures   US  LIMITED JOINT SPACE STRUCTURES LOW RIGHT(NO LINKED CHARGES)    Reason for Exam (SYMPTOM  OR DIAGNOSIS REQUIRED):   right knee pain    Preferred imaging location?:   [facility] referral to Physical Therapy    Referral Priority:   Routine    Referral Type:   Physical Medicine    Referral Reason:   Specialty Services Required    Requested Specialty:   Physical Therapy    Number of Visits Requested:   1   No orders of the defined types were placed in this encounter.    Discussed warning signs or symptoms. Please see discharge instructions. Patient expresses understanding.   The above documentation has been reviewed and is accurate and complete Artist Lloyd, M.D.

## 2023-06-09 NOTE — Patient Instructions (Addendum)
 Thank you for coming in today.   You received an injection today. Seek immediate medical attention if the joint becomes red, extremely painful, or is oozing fluid.   I've referred you to Physical Therapy.  Let us know if you don't hear from them in one week.   Check back as needed

## 2023-06-12 NOTE — Therapy (Signed)
 OUTPATIENT PHYSICAL THERAPY LOWER EXTREMITY TREATMENT   Patient Name: Melissa Buchanan MRN: [id number] DOB:June 14, 1950, 73 y.o., female Today's Date: 06/16/2023  END OF SESSION:  PT End of Session - 06/16/23 0801     Visit Number 7    Date for PT Re-Evaluation 07/04/23    Authorization Type [employer]    Authorization - Visit Number 2    Authorization - Number of Visits 5    PT Start Time 0800    PT Stop Time 0845    PT Time Calculation (min) 45 min    Activity Tolerance Patient tolerated treatment well    Behavior During Therapy WFL for tasks assessed/performed                 Past Medical History:  Diagnosis Date   Chicken pox    73 years old   Fibroids    H/O   Migraines    73 years old   No pertinent past medical history    PMB (postmenopausal bleeding)    H/O   Seasonal allergies    Past Surgical History:  Procedure Laterality Date   BUNIONECTOMY  02/25/2012   ENDOMETRIAL BIOPSY     HYSTEROSCOPY  11/12   WITH RESECTION D & C   MYOMECTOMY     WISDOM TOOTH EXTRACTION     Patient Active Problem List   Diagnosis Date Noted   Stress fracture of lower leg 06/11/2017   Chest pain 11/13/2014   Routine general medical examination at a health care facility 05/09/2013   Need for prophylactic vaccination with combined diphtheria-tetanus-pertussis (DTP) vaccine 05/09/2013   Need for prophylactic vaccination and inoculation against other viral diseases(V04.89) 05/09/2013    PCP: Catheryn Greenland  REFERRING PROVIDER: Joane Birmingham  REFERRING DIAG:  [phone] (ICD-10-CM) - Chronic pain of right knee    THERAPY DIAG:  Right knee pain, unspecified chronicity  Stiffness of right knee, not elsewhere classified  Muscle weakness (generalized)  Difficulty navigating stairs  Rationale for Evaluation and Treatment: Rehabilitation  ONSET DATE: 01/27/23  SUBJECTIVE:   SUBJECTIVE STATEMENT: I went to the doctor and they gave me a cortisone shot. The shot helped, I  can move!   PERTINENT HISTORY: Feeling alright, knees are sore. I have been resting it.   PAIN:  Are you having pain? No  PRECAUTIONS: None  RED FLAGS: None   WEIGHT BEARING RESTRICTIONS: No  FALLS:  Has patient fallen in last 6 months? No  LIVING ENVIRONMENT: Lives with: lives with their family Lives in: House/apartment Stairs: Yes: Internal: 15 steps; can reach both and External: 3 steps; none Has following equipment at home: None  OCCUPATION: Retired  PLOF: Independent  PATIENT GOALS: find out why my knee is not moving right   NEXT MD VISIT: Don't have one   OBJECTIVE:  Note: Objective measures were completed at Evaluation unless otherwise noted.  DIAGNOSTIC FINDINGS: N/A for knee  PATIENT SURVEYS:  FOTO 70   PALPATION: No TTP  LOWER EXTREMITY ROM: WNL    LOWER EXTREMITY MMT: 5/5   LOWER EXTREMITY SPECIAL TESTS:  Knee special tests: Thessaly test: negative  FUNCTIONAL TESTS:  5 times sit to stand: 8.41s  GAIT: Distance walked: in clinic distances Assistive device utilized: None Level of assistance: Complete Independence Comments: slight antalgic gait due to guarding her R knee.    TODAY'S TREATMENT:  DATE:  06/16/23 Bike L3 x42mins  Recheck goals Step ups 6 and lateral steps  Resisted side steps 20# x5 each way  Leg ext 15# x10, 20# x10  HS curls 35# 2x10 Calf raises 2x12  Calf stretch 30s x2  Leg press 40# x10, 60# x10  Wall slides 2x10   06/09/23 NuStep L5 x31mins LE only  Treadmill pushes 30s x2  Calf raises 2x12  5# hip abd and ext 2x10 Step ups 6  Lateral band walks Monster walks  STS on airex with OHP 2x10  05/29/23 NuStep L5 x57mins  Resisted gait 30# 4 way x4  Step ups 6  Lateral step ups 6  Calf stretch on slant 15s x2 Leg press 60# 2x10 Leg ext 15# 2x10 HS curls 35# 2x10 STS with 5#  dumbbell 2x10  05/27/23 Recumbent bike L2 x 5 min Leg press double leg 40# x10 Leg press eccentrics on right x10 at 40# Leg press single leg 30# x10 Side step red TB 2x10 Backwards monster walk red TB 2x10 Wall slide red TB around knees 2x10x5 Primal push up 2x30 Quadruped Fire hydrant red TB 2x10  05/22/23 NuStep L5 x 6 min S2S holding yellow ball 2x10 HS curls 25lb 3x10 Leg Ext 15lb 3x10 Forward & Lateral step ups 6in x 10 each Heel raises 2x10 Resisted gait 30lb 4 way x 3 each  05/15/23 NuStep L5 x 5 min 4in step ups x10 6in step ups x 10  LAQ RLE 3lb 2x10 HS curls blue 2x15 S2S holding yellow ball 2x10 Leg press 40lb 2x12 Shuffle shuffle squat squat 10 45sec plank Standing march 5lb over head x175 total  EVAL 05/08/23    PATIENT EDUCATION:  Education details: Knee positioning with exercises Person educated: Patient Education method: Explanation Education comprehension: verbalized understanding  HOME EXERCISE PROGRAM: Access Code: [email or web address] URL: [email or web address] Date: 05/08/2023 Prepared by: Almetta Fam  Exercises - Side Stepping with Resistance at Ankles  - 1 x daily - 7 x weekly - 3 sets - 10 reps - Step Up  - 1 x daily - 7 x weekly - 3 sets - 10 reps - Lateral Step Up  - 1 x daily - 7 x weekly - 3 sets - 10 reps - Forward Step Down  - 1 x daily - 7 x weekly - 3 sets - 10 reps - Lateral Step Down  - 1 x daily - 7 x weekly - 3 sets - 10 reps  ASSESSMENT:  CLINICAL IMPRESSION: Patient is a 73 y.o. female who was seen today for physical therapy treatment for R knee pain. She received a cortisone shot last Monday, and reports it has helped with her pain. Session focused on mostly knee and hip strengthening.  Reports she still has pain anytime she puts pressure on her knee and bends it, which is why stairs are still a problem. Continue to work on improving overall knee strength and gait to be able to flex knee without pain.   OBJECTIVE  IMPAIRMENTS: Abnormal gait, decreased balance, and pain.    GOALS: Goals reviewed with patient? Yes  SHORT TERM GOALS: 05/29/23  Patient will be independent with initial HEP. Baseline: given 05/08/23 Goal status: Ongoing 05/22/23, but she does attend an exercises class  MET 06/16/23  LONG TERM GOALS: Target date: 07/04/23  Patient will be independent with advanced/ongoing HEP to improve outcomes and carryover.  Goal status: INITIAL  2. Patient will be able to ascend/descend stairs without difficulty and pain.  Baseline:  feels weak and cannot control movement on R knee  Goal status: On going 05/22/23, ongoing 06/16/23  3.  Patient will be able to ambulate 516ft without a slight limp in her gait  Baseline: antalgic due to guarding R knee Goal status: IN PROGRESS 06/16/23   PLAN:  PT FREQUENCY: 1x/week  PT DURATION: 6 weeks  PLANNED INTERVENTIONS: 97110-Therapeutic exercises, 97530- Therapeutic activity, 97112- Neuromuscular re-education, 97535- Self Care, [id number]- Manual therapy, [phone]- Gait training, Patient/Family education, Balance training, Stair training, Taping, Cryotherapy, and Moist heat  PLAN FOR NEXT SESSION: step up variations, anterior and lateral step downs, R knee strengthening    Almetta Fam, PT 06/16/2023, 8:40 AM

## 2023-06-13 ENCOUNTER — Encounter: Payer: Self-pay | Admitting: Family Medicine

## 2023-06-16 ENCOUNTER — Ambulatory Visit: Payer: Medicare PPO

## 2023-06-16 DIAGNOSIS — M25561 Pain in right knee: Secondary | ICD-10-CM | POA: Diagnosis not present

## 2023-06-16 DIAGNOSIS — Z789 Other specified health status: Secondary | ICD-10-CM

## 2023-06-16 DIAGNOSIS — M25661 Stiffness of right knee, not elsewhere classified: Secondary | ICD-10-CM

## 2023-06-16 DIAGNOSIS — M6281 Muscle weakness (generalized): Secondary | ICD-10-CM

## 2023-06-23 ENCOUNTER — Ambulatory Visit: Payer: Medicare PPO | Admitting: Physical Therapy

## 2023-06-23 ENCOUNTER — Encounter: Payer: Self-pay | Admitting: Physical Therapy

## 2023-06-23 DIAGNOSIS — M25661 Stiffness of right knee, not elsewhere classified: Secondary | ICD-10-CM

## 2023-06-23 DIAGNOSIS — M6281 Muscle weakness (generalized): Secondary | ICD-10-CM

## 2023-06-23 DIAGNOSIS — M25561 Pain in right knee: Secondary | ICD-10-CM

## 2023-06-23 NOTE — Therapy (Signed)
OUTPATIENT PHYSICAL THERAPY LOWER EXTREMITY TREATMENT   Patient Name: Melissa Buchanan MRN: [id number] DOB:12-Nov-1950, 73 y.o., female Today's Date: 06/23/2023  END OF SESSION:  PT End of Session - 06/23/23 1154     Visit Number 8    Date for PT Re-Evaluation 07/04/23    PT Start Time 1154    PT Stop Time 1230    PT Time Calculation (min) 36 min    Activity Tolerance Patient tolerated treatment well    Behavior During Therapy WFL for tasks assessed/performed                 Past Medical History:  Diagnosis Date   Chicken pox    73 years old   Fibroids    H/O   Migraines    74 years old   No pertinent past medical history    PMB (postmenopausal bleeding)    H/O   Seasonal allergies    Past Surgical History:  Procedure Laterality Date   BUNIONECTOMY  02/25/2012   ENDOMETRIAL BIOPSY     HYSTEROSCOPY  11/12   WITH RESECTION D & C   MYOMECTOMY     WISDOM TOOTH EXTRACTION     Patient Active Problem List   Diagnosis Date Noted   Stress fracture of lower leg 06/11/2017   Chest pain 11/13/2014   Routine general medical examination at a health care facility 05/09/2013   Need for prophylactic vaccination with combined diphtheria-tetanus-pertussis (DTP) vaccine 05/09/2013   Need for prophylactic vaccination and inoculation against other viral diseases(V04.89) 05/09/2013    PCP: Birdena Jubilee  REFERRING PROVIDER: Clementeen Graham  REFERRING DIAG:  [phone] (ICD-10-CM) - Chronic pain of right knee    THERAPY DIAG:  Right knee pain, unspecified chronicity  Stiffness of right knee, not elsewhere classified  Muscle weakness (generalized)  Rationale for Evaluation and Treatment: Rehabilitation  ONSET DATE: 01/27/23  SUBJECTIVE:   SUBJECTIVE STATEMENT: "Its getting their"  PERTINENT HISTORY: Feeling alright, knees are sore. I have been resting it.   PAIN:  Are you having pain? No  PRECAUTIONS: None  RED FLAGS: None   WEIGHT BEARING RESTRICTIONS:  No  FALLS:  Has patient fallen in last 6 months? No  LIVING ENVIRONMENT: Lives with: lives with their family Lives in: House/apartment Stairs: Yes: Internal: 15 steps; can reach both and External: 3 steps; none Has following equipment at home: None  OCCUPATION: Retired  PLOF: Independent  PATIENT GOALS: find out why my knee is not moving right   NEXT MD VISIT: Don't have one   OBJECTIVE:  Note: Objective measures were completed at Evaluation unless otherwise noted.  DIAGNOSTIC FINDINGS: N/A for knee  PATIENT SURVEYS:  FOTO 70   PALPATION: No TTP  LOWER EXTREMITY ROM: WNL    LOWER EXTREMITY MMT: 5/5   LOWER EXTREMITY SPECIAL TESTS:  Knee special tests: Thessaly test: negative  FUNCTIONAL TESTS:  5 times sit to stand: 8.41s  GAIT: Distance walked: in clinic distances Assistive device utilized: None Level of assistance: Complete Independence Comments: slight antalgic gait due to guarding her R knee.    TODAY'S TREATMENT:  DATE:  06/23/23 NuStep L5 x4 mins LE only  Bike L3 x3 mins  8in steps ups  8in lateral step ups  Heel raises 2x15  HS curls 35lb 2x12  Leg Ext 15lb 2x12  S2S OHP yellow ball 2x10   06/16/23 Bike L3 x70mins  Recheck goals Step ups 6" and lateral steps  Resisted side steps 20# x5 each way  Leg ext 15# x10, 20# x10  HS curls 35# 2x10 Calf raises 2x12  Calf stretch 30s x2  Leg press 40# x10, 60# x10  Wall slides 2x10  Ressited side steps 30lb x 5 each  06/09/23 NuStep L5 x51mins LE only  Treadmill pushes 30s x2  Calf raises 2x12  5# hip abd and ext 2x10 Step ups 6"  Lateral band walks Monster walks  STS on airex with OHP 2x10  05/29/23 NuStep L5 x34mins  Resisted gait 30# 4 way x4  Step ups 6"  Lateral step ups 6"  Calf stretch on slant 15s x2 Leg press 60# 2x10 Leg ext 15# 2x10 HS curls 35#  2x10 STS with 5# dumbbell 2x10  05/27/23 Recumbent bike L2 x 5 min Leg press double leg 40# x10 Leg press eccentrics on right x10 at 40# Leg press single leg 30# x10 Side step red TB 2x10 Backwards monster walk red TB 2x10 Wall slide red TB around knees 2x10x5" Primal push up 2x30" Quadruped Fire hydrant red TB 2x10  05/22/23 NuStep L5 x 6 min S2S holding yellow ball 2x10 HS curls 25lb 3x10 Leg Ext 15lb 3x10 Forward & Lateral step ups 6in x 10 each Heel raises 2x10 Resisted gait 30lb 4 way x 3 each  05/15/23 NuStep L5 x 5 min 4in step ups x10 6in step ups x 10  LAQ RLE 3lb 2x10 HS curls blue 2x15 S2S holding yellow ball 2x10 Leg press 40lb 2x12 Shuffle shuffle squat squat 10 45sec plank Standing march 5lb over head x175 total  EVAL 05/08/23    PATIENT EDUCATION:  Education details: Knee positioning with exercises Person educated: Patient Education method: Explanation Education comprehension: verbalized understanding  HOME EXERCISE PROGRAM: Access Code: [email or web address] URL: [email or web address] Date: 05/08/2023 Prepared by: Cassie Freer  Exercises - Side Stepping with Resistance at Ankles  - 1 x daily - 7 x weekly - 3 sets - 10 reps - Step Up  - 1 x daily - 7 x weekly - 3 sets - 10 reps - Lateral Step Up  - 1 x daily - 7 x weekly - 3 sets - 10 reps - Forward Step Down  - 1 x daily - 7 x weekly - 3 sets - 10 reps - Lateral Step Down  - 1 x daily - 7 x weekly - 3 sets - 10 reps  ASSESSMENT:  CLINICAL IMPRESSION: Patient is a 73 y.o. female who was seen today for physical therapy treatment for R knee pain. She enters ~ 13 minutes late. Continue to work on improving overall knee strength and gait to be able to flex knee without pain. Cue for pacing needed with step ups. All interventions completed well. Some instability with resisted sides steps due to narrow base of support at times stepping feet too close together,  OBJECTIVE IMPAIRMENTS: Abnormal  gait, decreased balance, and pain.    GOALS: Goals reviewed with patient? Yes  SHORT TERM GOALS: 05/29/23  Patient will be independent with initial HEP. Baseline: given 05/08/23 Goal status: Ongoing 05/22/23, but she does attend an exercises class  MET 06/16/23  LONG TERM GOALS: Target date: 07/04/23  Patient will be independent with advanced/ongoing HEP to improve outcomes and carryover.  Goal status: INITIAL  2. Patient will be able to ascend/descend stairs without difficulty and pain.  Baseline: feels weak and cannot control movement on R knee  Goal status: On going 05/22/23, ongoing 06/16/23  3.  Patient will be able to ambulate 513ft without a slight limp in her gait  Baseline: antalgic due to guarding R knee Goal status: IN PROGRESS 06/16/23   PLAN:  PT FREQUENCY: 1x/week  PT DURATION: 6 weeks  PLANNED INTERVENTIONS: 97110-Therapeutic exercises, 97530- Therapeutic activity, 97112- Neuromuscular re-education, 97535- Self Care, [id number]- Manual therapy, [phone]- Gait training, Patient/Family education, Balance training, Stair training, Taping, Cryotherapy, and Moist heat  PLAN FOR NEXT SESSION: step up variations, anterior and lateral step downs, R knee strengthening    Grayce Sessions, PTA 06/23/2023, 11:54 AM

## 2023-06-30 ENCOUNTER — Encounter: Payer: Self-pay | Admitting: Physical Therapy

## 2023-06-30 ENCOUNTER — Ambulatory Visit: Payer: Medicare PPO | Admitting: Physical Therapy

## 2023-06-30 DIAGNOSIS — M25561 Pain in right knee: Secondary | ICD-10-CM | POA: Diagnosis not present

## 2023-06-30 DIAGNOSIS — M25661 Stiffness of right knee, not elsewhere classified: Secondary | ICD-10-CM

## 2023-06-30 DIAGNOSIS — Z789 Other specified health status: Secondary | ICD-10-CM

## 2023-06-30 DIAGNOSIS — M6281 Muscle weakness (generalized): Secondary | ICD-10-CM

## 2023-06-30 NOTE — Therapy (Signed)
OUTPATIENT PHYSICAL THERAPY LOWER EXTREMITY TREATMENT   Patient Name: Melissa Buchanan MRN: [id number] DOB:[birth year], 73 y.o., female Today's Date: 06/30/2023  END OF SESSION:  PT End of Session - 06/30/23 0803     Visit Number 9    Date for PT Re-Evaluation 07/04/23    Authorization Type [employer] 4/5 by 07/04/23    PT Start Time 0759    PT Stop Time 0846    PT Time Calculation (min) 47 min    Activity Tolerance Patient tolerated treatment well    Behavior During Therapy [facility] for tasks assessed/performed                 Past Medical History:  Diagnosis Date   Chicken pox    73 years old   Fibroids    H/O   Migraines    73 years old   No pertinent past medical history    PMB (postmenopausal bleeding)    H/O   Seasonal allergies    Past Surgical History:  Procedure Laterality Date   BUNIONECTOMY  02/25/2012   ENDOMETRIAL BIOPSY     HYSTEROSCOPY  11/12   WITH RESECTION D & C   MYOMECTOMY     WISDOM TOOTH EXTRACTION     Patient Active Problem List   Diagnosis Date Noted   Stress fracture of lower leg 06/11/2017   Chest pain 11/13/2014   Routine general medical examination at a health care facility 05/09/2013   Need for prophylactic vaccination with combined diphtheria-tetanus-pertussis (DTP) vaccine 05/09/2013   Need for prophylactic vaccination and inoculation against other viral diseases(V04.89) 05/09/2013    PCP: Birdena Jubilee  REFERRING PROVIDER: Clementeen Graham  REFERRING DIAG:  [phone] (ICD-10-CM) - Chronic pain of right knee    THERAPY DIAG:  Right knee pain, unspecified chronicity  Stiffness of right knee, not elsewhere classified  Muscle weakness (generalized)  Difficulty navigating stairs  Rationale for Evaluation and Treatment: Rehabilitation  ONSET DATE: 01/27/23  SUBJECTIVE:   SUBJECTIVE STATEMENT: No bad just a little sore.  PERTINENT HISTORY: Feeling alright, knees are sore. I have been resting it.   PAIN:  Are you having  pain? No  PRECAUTIONS: None  RED FLAGS: None   WEIGHT BEARING RESTRICTIONS: No  FALLS:  Has patient fallen in last 6 months? No  LIVING ENVIRONMENT: Lives with: lives with their family Lives in: House/apartment Stairs: Yes: Internal: 15 steps; can reach both and External: 3 steps; none Has following equipment at home: None  OCCUPATION: Retired  PLOF: Independent  PATIENT GOALS: find out why my knee is not moving right   NEXT MD VISIT: Don't have one   OBJECTIVE:  Note: Objective measures were completed at Evaluation unless otherwise noted.  DIAGNOSTIC FINDINGS: N/A for knee  PATIENT SURVEYS:  FOTO 70   PALPATION: No TTP  LOWER EXTREMITY ROM: WNL    LOWER EXTREMITY MMT: 5/5   LOWER EXTREMITY SPECIAL TESTS:  Knee special tests: Thessaly test: negative  FUNCTIONAL TESTS:  5 times sit to stand: 8.41s  GAIT: Distance walked: in clinic distances Assistive device utilized: None Level of assistance: Complete Independence Comments: slight antalgic gait due to guarding her R knee.    TODAY'S TREATMENT:  DATE:  06/30/23 Nustep level 5 x 6 minutes Bike level 3 x 5 minutes Forward & Lateral 8in step ups x10 each  HS curls 35# 2 x12, RLE 20lb x10 Leg Ext 15lb 2x12  Heel raises 2x15  S2S OHP yellow ball 2x10 Sides steps on and off airex  06/23/23 NuStep L5 x4 mins LE only  Bike L3 x3 mins  8in steps ups  8in lateral step ups  Heel raises 2x15  HS curls 35lb 2x12  Leg Ext 15lb 2x12  S2S OHP yellow ball 2x10   06/16/23 Bike L3 x30mins  Recheck goals Step ups 6" and lateral steps  Resisted side steps 20# x5 each way  Leg ext 15# x10, 20# x10  HS curls 35# 2x10 Calf raises 2x12  Calf stretch 30s x2  Leg press 40# x10, 60# x10  Wall slides 2x10  Ressited side steps 30lb x 5 each  06/09/23 NuStep L5 x3mins LE only  Treadmill  pushes 30s x2  Calf raises 2x12  5# hip abd and ext 2x10 Step ups 6"  Lateral band walks Monster walks  STS on airex with OHP 2x10  05/29/23 NuStep L5 x63mins  Resisted gait 30# 4 way x4  Step ups 6"  Lateral step ups 6"  Calf stretch on slant 15s x2 Leg press 60# 2x10 Leg ext 15# 2x10 HS curls 35# 2x10 STS with 5# dumbbell 2x10  05/27/23 Recumbent bike L2 x 5 min Leg press double leg 40# x10 Leg press eccentrics on right x10 at 40# Leg press single leg 30# x10 Side step red TB 2x10 Backwards monster walk red TB 2x10 Wall slide red TB around knees 2x10x5" Primal push up 2x30" Quadruped Fire hydrant red TB 2x10  05/22/23 NuStep L5 x 6 min S2S holding yellow ball 2x10 HS curls 25lb 3x10 Leg Ext 15lb 3x10 Forward & Lateral step ups 6in x 10 each Heel raises 2x10 Resisted gait 30lb 4 way x 3 each  05/15/23 NuStep L5 x 5 min 4in step ups x10 6in step ups x 10  LAQ RLE 3lb 2x10 HS curls blue 2x15 S2S holding yellow ball 2x10 Leg press 40lb 2x12 Shuffle shuffle squat squat 10 45sec plank Standing march 5lb over head x175 total  EVAL 05/08/23    PATIENT EDUCATION:  Education details: Knee positioning with exercises Person educated: Patient Education method: Explanation Education comprehension: verbalized understanding  HOME EXERCISE PROGRAM: Access Code: [email or web address] URL: [email or web address] Date: 05/08/2023 Prepared by: Cassie Freer  Exercises - Side Stepping with Resistance at Ankles  - 1 x daily - 7 x weekly - 3 sets - 10 reps - Step Up  - 1 x daily - 7 x weekly - 3 sets - 10 reps - Lateral Step Up  - 1 x daily - 7 x weekly - 3 sets - 10 reps - Forward Step Down  - 1 x daily - 7 x weekly - 3 sets - 10 reps - Lateral Step Down  - 1 x daily - 7 x weekly - 3 sets - 10 reps  ASSESSMENT:  CLINICAL IMPRESSION: Patient is a 73 y.o. female who was seen today for physical therapy treatment for R knee pain. Continue to work on improving overall  knee strength and gait to be able to flex knee without pain. Cue to hold quad contraction needed with LAQ. Some instability present stepping on and off airex.. All interventions completed well.   OBJECTIVE IMPAIRMENTS: Abnormal gait, decreased balance, and pain.    GOALS:  Goals reviewed with patient? Yes  SHORT TERM GOALS: 05/29/23  Patient will be independent with initial HEP. Baseline: given 05/08/23 Goal status: Ongoing 05/22/23, but she does attend an exercises class  MET 06/16/23  LONG TERM GOALS: Target date: 07/04/23  Patient will be independent with advanced/ongoing HEP to improve outcomes and carryover.  Goal status: INITIAL  2. Patient will be able to ascend/descend stairs without difficulty and pain.  Baseline: feels weak and cannot control movement on R knee  Goal status: On going 05/22/23, ongoing 06/16/23  3.  Patient will be able to ambulate 533ft without a slight limp in her gait  Baseline: antalgic due to guarding R knee Goal status: IN PROGRESS 06/16/23   PLAN:  PT FREQUENCY: 1x/week  PT DURATION: 6 weeks  PLANNED INTERVENTIONS: 97110-Therapeutic exercises, 97530- Therapeutic activity, 97112- Neuromuscular re-education, 97535- Self Care, 16109- Manual therapy, [phone]- Gait training, Patient/Family education, Balance training, Stair training, Taping, Cryotherapy, and Moist heat  PLAN FOR NEXT SESSION: step up variations, anterior and lateral step downs, R knee strengthening    Debroah Baller, PTA 06/30/2023, 8:04 AM

## 2023-07-03 ENCOUNTER — Encounter: Payer: Self-pay | Admitting: Physical Therapy

## 2023-07-03 ENCOUNTER — Ambulatory Visit: Payer: Medicare PPO | Admitting: Physical Therapy

## 2023-07-03 DIAGNOSIS — M6281 Muscle weakness (generalized): Secondary | ICD-10-CM

## 2023-07-03 DIAGNOSIS — M25561 Pain in right knee: Secondary | ICD-10-CM

## 2023-07-03 DIAGNOSIS — M25511 Pain in right shoulder: Secondary | ICD-10-CM

## 2023-07-03 DIAGNOSIS — M25611 Stiffness of right shoulder, not elsewhere classified: Secondary | ICD-10-CM

## 2023-07-03 DIAGNOSIS — Z789 Other specified health status: Secondary | ICD-10-CM

## 2023-07-03 DIAGNOSIS — M25661 Stiffness of right knee, not elsewhere classified: Secondary | ICD-10-CM

## 2023-07-03 NOTE — Therapy (Signed)
OUTPATIENT PHYSICAL THERAPY LOWER EXTREMITY TREATMENT Progress Note Reporting Period 05/08/23 to 07/03/23  See note below for Objective Data and Assessment of Progress/Goals.      Patient Name: Melissa Buchanan MRN: [id number] DOB:1950-09-16, 73 y.o., female Today's Date: 07/03/2023  END OF SESSION:  PT End of Session - 07/03/23 0803     Visit Number 10    Date for PT Re-Evaluation 07/04/23    PT Start Time 0800    PT Stop Time 0845    PT Time Calculation (min) 45 min    Activity Tolerance Patient tolerated treatment well    Behavior During Therapy WFL for tasks assessed/performed                 Past Medical History:  Diagnosis Date   Chicken pox    73 years old   Fibroids    H/O   Migraines    73 years old   No pertinent past medical history    PMB (postmenopausal bleeding)    H/O   Seasonal allergies    Past Surgical History:  Procedure Laterality Date   BUNIONECTOMY  02/25/2012   ENDOMETRIAL BIOPSY     HYSTEROSCOPY  11/12   WITH RESECTION D & C   MYOMECTOMY     WISDOM TOOTH EXTRACTION     Patient Active Problem List   Diagnosis Date Noted   Stress fracture of lower leg 06/11/2017   Chest pain 11/13/2014   Routine general medical examination at a health care facility 05/09/2013   Need for prophylactic vaccination with combined diphtheria-tetanus-pertussis (DTP) vaccine 05/09/2013   Need for prophylactic vaccination and inoculation against other viral diseases(V04.89) 05/09/2013    PCP: Birdena Jubilee  REFERRING PROVIDER: Clementeen Graham  REFERRING DIAG:  [phone] (ICD-10-CM) - Chronic pain of right knee    THERAPY DIAG:  Right knee pain, unspecified chronicity  Stiffness of right knee, not elsewhere classified  Muscle weakness (generalized)  Difficulty navigating stairs  Stiffness of right shoulder, not elsewhere classified  Acute pain of right shoulder  Rationale for Evaluation and Treatment: Rehabilitation  ONSET DATE:  01/27/23  SUBJECTIVE:   SUBJECTIVE STATEMENT: I worked out this morning. Knee is all right  PERTINENT HISTORY: Feeling alright, knees are sore. I have been resting it.   PAIN:  Are you having pain? No  PRECAUTIONS: None  RED FLAGS: None   WEIGHT BEARING RESTRICTIONS: No  FALLS:  Has patient fallen in last 6 months? No  LIVING ENVIRONMENT: Lives with: lives with their family Lives in: House/apartment Stairs: Yes: Internal: 15 steps; can reach both and External: 3 steps; none Has following equipment at home: None  OCCUPATION: Retired  PLOF: Independent  PATIENT GOALS: find out why my knee is not moving right   NEXT MD VISIT: Don't have one   OBJECTIVE:  Note: Objective measures were completed at Evaluation unless otherwise noted.  DIAGNOSTIC FINDINGS: N/A for knee  PATIENT SURVEYS:  FOTO 70   PALPATION: No TTP  LOWER EXTREMITY ROM: WNL    LOWER EXTREMITY MMT: 5/5   LOWER EXTREMITY SPECIAL TESTS:  Knee special tests: Thessaly test: negative  FUNCTIONAL TESTS:  5 times sit to stand: 8.41s  GAIT: Distance walked: in clinic distances Assistive device utilized: None Level of assistance: Complete Independence Comments: slight antalgic gait due to guarding her R knee.    TODAY'S TREATMENT:  DATE:  07/03/23 NuStep L 5 x 6 min LE only  Stairs Alt pattern "can feel her R knee" Attempt eccentric lateral step downs but stopped due to R knee pain Forward & Lateral 8in step ups x10 each  Heel raises 2x15  HS curls 35# 2 x12, RLE 15lb 2x10 Leg Ext 15lb 2x10, RLE 5lb 2x10 S2S OHP yellow ball x10  06/30/23 Nustep level 5 x 6 minutes Bike level 3 x 5 minutes Forward & Lateral 8in step ups x10 each  HS curls 35# 2 x12, RLE 20lb x10 Leg Ext 15lb 2x12  Heel raises 2x15  S2S OHP yellow ball 2x10 Sides steps on and off  airex  06/23/23 NuStep L5 x4 mins LE only  Bike L3 x3 mins  8in steps ups  8in lateral step ups  Heel raises 2x15  HS curls 35lb 2x12  Leg Ext 15lb 2x12  S2S OHP yellow ball 2x10   06/16/23 Bike L3 x73mins  Recheck goals Step ups 6" and lateral steps  Resisted side steps 20# x5 each way  Leg ext 15# x10, 20# x10  HS curls 35# 2x10 Calf raises 2x12  Calf stretch 30s x2  Leg press 40# x10, 60# x10  Wall slides 2x10  Ressited side steps 30lb x 5 each  06/09/23 NuStep L5 x27mins LE only  Treadmill pushes 30s x2  Calf raises 2x12  5# hip abd and ext 2x10 Step ups 6"  Lateral band walks Monster walks  STS on airex with OHP 2x10  05/29/23 NuStep L5 x73mins  Resisted gait 30# 4 way x4  Step ups 6"  Lateral step ups 6"  Calf stretch on slant 15s x2 Leg press 60# 2x10 Leg ext 15# 2x10 HS curls 35# 2x10 STS with 5# dumbbell 2x10  05/27/23 Recumbent bike L2 x 5 min Leg press double leg 40# x10 Leg press eccentrics on right x10 at 40# Leg press single leg 30# x10 Side step red TB 2x10 Backwards monster walk red TB 2x10 Wall slide red TB around knees 2x10x5" Primal push up 2x30" Quadruped Fire hydrant red TB 2x10    PATIENT EDUCATION:  Education details: Knee positioning with exercises Person educated: Patient Education method: Explanation Education comprehension: verbalized understanding  HOME EXERCISE PROGRAM: Access Code: [email or web address] URL: [email or web address] Date: 05/08/2023 Prepared by: Cassie Freer  Exercises - Side Stepping with Resistance at Ankles  - 1 x daily - 7 x weekly - 3 sets - 10 reps - Step Up  - 1 x daily - 7 x weekly - 3 sets - 10 reps - Lateral Step Up  - 1 x daily - 7 x weekly - 3 sets - 10 reps - Forward Step Down  - 1 x daily - 7 x weekly - 3 sets - 10 reps - Lateral Step Down  - 1 x daily - 7 x weekly - 3 sets - 10 reps  ASSESSMENT:  CLINICAL IMPRESSION: Patient is a 73 y.o. female who was seen today for physical therapy  treatment for R knee pain. Continue to work on improving overall knee strength and gait to be able to flex knee without pain. She continues to reports R knee pain with stairs. Attempted eccentric step downs but stopped due to pain. Compensation noted with step ps due to weakness. Will D/C PT due to insurance issues  OBJECTIVE IMPAIRMENTS: Abnormal gait, decreased balance, and pain.    GOALS: Goals reviewed with patient? Yes  SHORT TERM GOALS: 05/29/23  Patient will be independent  with initial HEP. Baseline: given 05/08/23 Goal status: Ongoing 05/22/23, but she does attend an exercises class  MET 06/16/23  LONG TERM GOALS: Target date: 07/04/23  Patient will be independent with advanced/ongoing HEP to improve outcomes and carryover.  Goal status: Met 07/03/23  2. Patient will be able to ascend/descend stairs without difficulty and pain.  Baseline: feels weak and cannot control movement on R knee  Goal status: On going 05/22/23, ongoing 06/16/23  3.  Patient will be able to ambulate 563ft without a slight limp in her gait  Baseline: antalgic due to guarding R knee Goal status: Met 07/03/23   PLAN:  PT FREQUENCY: 1x/week  PT DURATION: 6 weeks  PLANNED INTERVENTIONS: 97110-Therapeutic exercises, 97530- Therapeutic activity, 97112- Neuromuscular re-education, 97535- Self Care, [id number]- Manual therapy, [phone]- Gait training, Patient/Family education, Balance training, Stair training, Taping, Cryotherapy, and Moist heat  PLAN FOR NEXT SESSION: D/C PT PHYSICAL THERAPY DISCHARGE SUMMARY  Visits from Start of Care: 10  Patient agrees to discharge. Patient goals were partially met. Patient is being discharged due to financial reasons.   Cassie Freer, PT, DPT Debroah Baller, PTA 07/03/2023, 9:14 AM

## 2023-08-11 ENCOUNTER — Ambulatory Visit: Admitting: Podiatry

## 2023-08-11 ENCOUNTER — Ambulatory Visit (INDEPENDENT_AMBULATORY_CARE_PROVIDER_SITE_OTHER)

## 2023-08-11 ENCOUNTER — Encounter: Payer: Self-pay | Admitting: Podiatry

## 2023-08-11 VITALS — Ht 63.0 in | Wt 168.0 lb

## 2023-08-11 DIAGNOSIS — R262 Difficulty in walking, not elsewhere classified: Secondary | ICD-10-CM | POA: Diagnosis not present

## 2023-08-11 DIAGNOSIS — M79672 Pain in left foot: Secondary | ICD-10-CM | POA: Diagnosis not present

## 2023-08-11 DIAGNOSIS — R600 Localized edema: Secondary | ICD-10-CM

## 2023-08-11 DIAGNOSIS — S93692A Other sprain of left foot, initial encounter: Secondary | ICD-10-CM | POA: Diagnosis not present

## 2023-08-11 MED ORDER — MELOXICAM 15 MG PO TABS: 15.0000 mg | ORAL_TABLET | Freq: Every day | ORAL | 0 refills | Status: DC

## 2023-08-11 NOTE — Progress Notes (Unsigned)
 Chief Complaint  Patient presents with   Callouses    " It just started coming up with some swelling on Friday and her coach asked her to soak in epsom salt and some cream helped some"    HPI: 73 y.o. female presents today noting pain and swelling and some difficulty with walking since last Friday or Saturday.  She has been performing workouts with some higher intensity training involving jumping.  She states that she did feel sudden pain to the area, but does not recall noticing any bruising to the area.  She wears good supportive sneakers, but states that she will be getting a new pair soon.  Her spouse is with her today.  Past Medical History:  Diagnosis Date   Chicken pox    73 years old   Fibroids    H/O   Migraines    73 years old   No pertinent past medical history    PMB (postmenopausal bleeding)    H/O   Seasonal allergies     Past Surgical History:  Procedure Laterality Date   BUNIONECTOMY  02/25/2012   ENDOMETRIAL BIOPSY     HYSTEROSCOPY  11/12   WITH RESECTION D & C   MYOMECTOMY     WISDOM TOOTH EXTRACTION      Allergies  Allergen Reactions   Egg-Derived Products Diarrhea and Nausea And Vomiting   Physical Exam: There are palpable pedal pulses noted.  There is localized edema to the plantar medial aspect of the left heel.  There is no pain on palpation to the posterior tibial tendon.  There is no pain with resistance along the posterior tibial tendon on the left.  There is significant pain on palpation to the plantar medial aspect of the left heel and the medial band of the plantar fascia.  The swelling and tenderness is not present on the right foot.  There is less tautness to the medial band of the plantar fascia on the left foot than the right.  This is concerning for a possible medial plantar fascial band rupture.  Radiographic Exam (left foot, 3 weightbearing views, 08/11/2023): Normal osseous mineralization.  There is decreased calcaneal inclination  angle and slight increased talar declination angle.  Slight met adductus noted of the midfoot.  No fracture seen.  Assessment/Plan of Care: 1. Left foot pain   2. Rupture of plantar fascia, left, initial encounter   3. Localized edema   4. Difficulty walking      Meds ordered this encounter  Medications   meloxicam (MOBIC) 15 MG tablet    Sig: Take 1 tablet (15 mg total) by mouth daily.    Dispense:  30 tablet    Refill:  0   FOR HOME USE ONLY DME PLANTAR FASCIA BRACE PR FT ARCH SUPRT PREMOLD LONGIT MR FOOT LEFT WO CONTRAST  Discussed clinical and radiographic findings with patient and her husband today.  Informed patient she is going to need to stay off the foot is much as possible for the next 7 to 10 days.  There is high suspicion for partial or full rupture of at least the medial plantar fascial band.  Will order an MRI to evaluate the integrity of this as well as the spring ligament.  She may perform upper extremity exercises for now and very low impact walking for minimal amount of time.  This will aggravate her pain however.  Rest ice elevation would be best right now.  Informed the  patient if the plantar fascia is ruptured we typically let the scar back down and heal on its own.  Prescription for meloxicam 15 mg 1 tablet p.o. daily sent to her pharmacy for anti-inflammatory and pain management.  As she was fitted for Powerstep inserts to place into her shoes.  Shoe recommendations were made if she is going to buy a new pair soon.  She may need to wait until the swelling has improved before trying on new shoes.  This may alter the overall fit of his shoe.  She was fitted for a plantar fascia brace for the left foot.  She wear this at all times ambulating.  Remove for sleeping and bathing or if just resting.  If this had been too uncomfortable we had planned on trying the speed brace but the medical assistant stated that the patient felt much better with the plantar fascia  brace.  Follow-up in 4 weeks for recheck and review of MRI results   Leshay Desaulniers DBurna Mortimer, DPM, FACFAS [facility]     [address] [place], [place] [address]                Office [phone]  Fax [phone]

## 2023-08-12 ENCOUNTER — Ambulatory Visit (INDEPENDENT_AMBULATORY_CARE_PROVIDER_SITE_OTHER)

## 2023-08-12 ENCOUNTER — Other Ambulatory Visit: Payer: Self-pay

## 2023-08-12 ENCOUNTER — Ambulatory Visit: Admitting: Family Medicine

## 2023-08-12 VITALS — BP 126/84 | Ht 63.0 in | Wt 171.0 lb

## 2023-08-12 DIAGNOSIS — G8929 Other chronic pain: Secondary | ICD-10-CM

## 2023-08-12 DIAGNOSIS — M1711 Unilateral primary osteoarthritis, right knee: Secondary | ICD-10-CM | POA: Diagnosis not present

## 2023-08-12 DIAGNOSIS — M25561 Pain in right knee: Secondary | ICD-10-CM

## 2023-08-12 NOTE — Patient Instructions (Addendum)
 Thank you for coming in today.   Please get an Xray today before you leave   Let's see what the x-ray says and plan from there

## 2023-08-12 NOTE — Progress Notes (Unsigned)
   Rubin Payor, PhD, LAT, ATC acting as a scribe for Clementeen Graham, MD.  Melissa Buchanan is a 73 y.o. female who presents to [facility] today for cont'd R knee pain. Pt was last seen by Dr. Denyse Amass on 06/09/23 and was given a R knee steroid injection. She was also referred to back to PT, completing 10 visits.  Today, pt reports R knee pain is slightly improved. She notes stiffness in her R knee. She doesn't feel like the prior CSI was helpful. Pain w/ deep knee flexion.   Dx imaging: 09/03/22 R knee XR   Pertinent review of systems: No fevers or chills  Relevant historical information: Osteopenia and a history of stress fracture in the leg.   Exam:  BP 126/84   Ht 5\' 3"  (1.6 m)   Wt 171 lb (77.6 kg)   BMI 30.29 kg/m  General: Well Developed, well nourished, and in no acute distress.   MSK: Right knee large joint effusion normal-appearing otherwise lacks full flexion range of motion. Intact strength. Stable ligamentous exam. Tender palpation medial joint line.    Lab and Radiology Results  Diagnostic Limited MSK Ultrasound of: Right knee Large joint effusion is present.  Intact quad and patellar tendons.  Significant degeneration medial joint line.  Tiny Baker's cyst is present. Impression: Large joint effusion, medial degeneration and small Baker's cyst.   X-ray images right knee obtained today personally and independently interpreted. Moderate medial degeneration with spurring medial joint line.  Mild patellofemoral DJD.  No acute fractures are visible. Await formal radiology review   Assessment and Plan: 73 y.o. female with chronic right knee pain and effusion.  The effusion is limiting range of motion which is her largest complaint today.  Her knee feels quite tight especially with yoga.  Conventional steroid injection was not very effective.  Will work on authorization for [employer] and if that is not able to get authorized then we will work  on authorization for hyaluronic acid injections.  Will aspirate the knee prior to injection.  Anticipate recheck in the near future to proceed with Zilretta.   PDMP not reviewed this encounter. Orders Placed This Encounter  Procedures   [place] LIMITED JOINT SPACE STRUCTURES LOW RIGHT(NO LINKED CHARGES)    Reason for Exam (SYMPTOM  OR DIAGNOSIS REQUIRED):   eval knee    Preferred imaging location?:   [facility] Knee AP/LAT W/[place] Right    Standing Status:   Future    Number of Occurrences:   1    Expiration Date:   09/12/2023    Reason for Exam (SYMPTOM  OR DIAGNOSIS REQUIRED):   right knee pain    Preferred imaging location?:   [facility]   No orders of the defined types were placed in this encounter.    Discussed warning signs or symptoms. Please see discharge instructions. Patient expresses understanding.   The above documentation has been reviewed and is accurate and complete Clementeen Graham, M.D.

## 2023-08-20 ENCOUNTER — Encounter: Payer: Self-pay | Admitting: Podiatry

## 2023-08-25 ENCOUNTER — Ambulatory Visit
Admission: RE | Admit: 2023-08-25 | Discharge: 2023-08-25 | Disposition: A | Discharge status: Home / Self Care | Source: Ambulatory Visit | Attending: Podiatry | Admitting: Podiatry

## 2023-08-25 DIAGNOSIS — S93692A Other sprain of left foot, initial encounter: Secondary | ICD-10-CM

## 2023-08-25 DIAGNOSIS — M79672 Pain in left foot: Secondary | ICD-10-CM

## 2023-08-28 ENCOUNTER — Encounter: Payer: Self-pay | Admitting: Family Medicine

## 2023-08-28 NOTE — Progress Notes (Signed)
Right knee x-ray shows medium arthritis in the knee.

## 2023-09-01 NOTE — Telephone Encounter (Signed)
 Forwarding to Dr. Denyse Amass to review and advise.   Per Visit note 08/12/23:  Assessment and Plan: 73 y.o. female with chronic right knee pain and effusion.  The effusion is limiting range of motion which is her largest complaint today.  Her knee feels quite tight especially with yoga.  Conventional steroid injection was not very effective.  Will work on authorization for [employer] and if that is not able to get authorized then we will work on authorization for hyaluronic acid injections.  Will aspirate the knee prior to injection.  Anticipate recheck in the near future to proceed with Zilretta.

## 2023-09-08 ENCOUNTER — Ambulatory Visit: Admitting: Podiatry

## 2023-09-16 ENCOUNTER — Encounter: Payer: Self-pay | Admitting: Podiatry

## 2023-09-16 ENCOUNTER — Ambulatory Visit: Admitting: Podiatry

## 2023-09-16 DIAGNOSIS — S93692D Other sprain of left foot, subsequent encounter: Secondary | ICD-10-CM

## 2023-09-16 DIAGNOSIS — S93692A Other sprain of left foot, initial encounter: Secondary | ICD-10-CM

## 2023-09-16 NOTE — Progress Notes (Unsigned)
 Chief Complaint  Patient presents with   Foot Pain    Follow up rupture fascia left   "Its definitely better. I feel a knot in my arch when I walk. I wear the little sleeve everyday. I am back exercising more too"   HPI: 73 y.o. female presents today for follow-up of injury to the left plantar fascia.  Her recent MRI did confirm a partial rupture of the plantar fascia and we need to review this today.  As she has been wearing the cam walker and notes no pain at this time.  She states she does feel a slight "lump" in the central arch on the plantar aspect of the left foot.  Overall, she states that she is feeling much better.  Past Medical History:  Diagnosis Date   Chicken pox    73 years old   Fibroids    H/O   Migraines    73 years old   No pertinent past medical history    PMB (postmenopausal bleeding)    H/O   Seasonal allergies     Past Surgical History:  Procedure Laterality Date   BUNIONECTOMY  02/25/2012   ENDOMETRIAL BIOPSY     HYSTEROSCOPY  11/12   WITH RESECTION D & C   MYOMECTOMY     WISDOM TOOTH EXTRACTION      Allergies  Allergen Reactions   Meloxicam Nausea And Vomiting   Egg-Derived Products Diarrhea and Nausea And Vomiting   Physical Exam: Palpable pedal pulses noted.  Decreased edema to the plantar fascia area on the left foot.  No ecchymosis is noted.  Minimal pain on palpation to the plantar medial and plantar central portions of the plantar fascia near the heel.  Manual muscle testing 5/5.  Epicritic sensation intact  -------------------------------------------------------------- Narrative & Impression  CLINICAL DATA:  Foot trauma. Tendon injury or dislocation suspected. Probable rupture medial band of plantar fascia of left foot.   EXAM: MRI OF THE LEFT FOOT WITHOUT CONTRAST   TECHNIQUE: Multiplanar, multisequence MR imaging of the left hindfoot was performed. The area of interest was the origin of the plantar fascial of the calcaneus.  Intravenous contrast was administered.   COMPARISON:  Left foot radiographs 08/11/2023   FINDINGS: TENDONS  Peroneal: Minimal peroneus longus and brevis tenosynovitis. No tendon tear is seen.   Posteromedial: Intact tibialis posterior, flexor digitorum longus, and flexor hallucis longus tendons.   Anterior: The tibialis anterior, extensor hallucis longus, and extensor digitorum longus tendons are intact.   Achilles: Intact.  Minimal fluid within the pre Achilles bursa.   Plantar Fascia: There is mild thickening and intermediate T2 signal at the origin of the medial band of the plantar fascia (coronal series 8, image 11 and sagittal series 7, image 18). There is more moderate edema around the slightly more distal aspect of the medial band of the plantar fascia (coronal series 8, image 13), and moderate thickening of the plantar fascia with intermediate T2 signal in this region. There is intermediate to increased T2 signal indicating a focal partial-thickness tear within the mid to medial aspect of the medial band of the plantar fascia (coronal images 15 through 17 and axial images 27 through 29), approximately 15 mm from the calcaneal origin with a 9 mm gap (axial image 28). The lateral band of the plantar fascia is intact.   LIGAMENTS  Lateral: There is moderate attenuation of the anterior talofibular ligament suggesting a remote partial-thickness tear. The calcaneofibular, posterior talofibular,  and anterior and posterior tibiofibular ligaments are intact.   Medial: The tibiotalar deep deltoid and tibial spring ligaments are intact.   CARTILAGE  Ankle Joint: Intact cartilage.   Subtalar Joints/Sinus Tarsi: The subtalar cartilage is intact. Fat is preserved within the sinus tarsi.   Bones: Mild dorsal talonavicular cartilage thinning and osteophytosis. Moderate cartilage thinning and mild to moderate subchondral cystic change within the third greater than the  adjacent fourth tarsometatarsal joints.   Other: The tarsal tunnel is unremarkable. The Lisfranc ligament complex is intact.   IMPRESSION: 1. Moderate fasciitis of the medial band of the plantar fascia, with a focal partial-thickness tear within the mid to medial aspect of the medial band of the plantar fascia, approximately 15 mm from the calcaneal origin. There is a 9 mm gap within the tear. 2. Moderate attenuation of the anterior talofibular ligament suggesting a remote partial-thickness tear. No surrounding edema. 3. 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    Electronically Signed   By: Bertina Broccoli M.D.   On: 09/12/2023 15:11    ----------------------------------------------------  Assessment/Plan of Care: 1. Rupture of plantar fascia, left, subsequent encounter     AMB REFERRAL TO PHYSICAL THERAPY  Discussed clinical and MRI findings with patient today.  Will get the patient set up for physical therapy to perform ultrasound and deep tissue massage to the area to prevent scar tissue and adhesions.  This may help resolve the "lump" to the area.  For the patient that we do not need to surgically reapproximate the plantar fascia, as typically chronic plantar fasciitis that is unresponsive to conservative care usually improves with surgical fasciotomy of the plantar fascia.  Recommend that she slowly progress from her cam walker to supportive sneakers with excellent arch support.  She needs to avoid high-impact activities for another 3 to 4 weeks but may resume low impact activities.  Follow-up towards the end of physical therapy or as needed.   Joe Murders, DPM, FACFAS [facility]     [address] [place], [place] [address]                Office [phone]  Fax [phone]

## 2023-10-01 DIAGNOSIS — M25561 Pain in right knee: Secondary | ICD-10-CM | POA: Insufficient documentation

## 2023-10-06 ENCOUNTER — Encounter: Payer: Self-pay | Admitting: Physical Therapy

## 2023-10-06 ENCOUNTER — Ambulatory Visit: Attending: Podiatry | Admitting: Physical Therapy

## 2023-10-06 ENCOUNTER — Other Ambulatory Visit: Payer: Self-pay

## 2023-10-06 DIAGNOSIS — M79672 Pain in left foot: Secondary | ICD-10-CM | POA: Insufficient documentation

## 2023-10-06 DIAGNOSIS — S93692D Other sprain of left foot, subsequent encounter: Secondary | ICD-10-CM | POA: Insufficient documentation

## 2023-10-06 DIAGNOSIS — R262 Difficulty in walking, not elsewhere classified: Secondary | ICD-10-CM | POA: Diagnosis present

## 2023-10-06 DIAGNOSIS — M6281 Muscle weakness (generalized): Secondary | ICD-10-CM | POA: Diagnosis present

## 2023-10-06 DIAGNOSIS — R29898 Other symptoms and signs involving the musculoskeletal system: Secondary | ICD-10-CM | POA: Diagnosis present

## 2023-10-06 NOTE — Therapy (Signed)
 OUTPATIENT PHYSICAL THERAPY LOWER EXTREMITY EVALUATION   Patient Name: Melissa Buchanan MRN: [id number] DOB:07-29-1950, 73 y.o., female Today's Date: 10/06/2023  END OF SESSION:  PT End of Session - 10/06/23 1307     Visit Number 1    Number of Visits 7    Date for PT Re-Evaluation 11/17/23    Authorization Type [employer]    Authorization Time Period 10/06/23    Progress Note Due on Visit 10    PT Start Time 1147    PT Stop Time 1227    PT Time Calculation (min) 40 min    Activity Tolerance Patient tolerated treatment well    Behavior During Therapy WFL for tasks assessed/performed             Past Medical History:  Diagnosis Date   Chicken pox    73 years old   Fibroids    H/O   Migraines    73 years old   No pertinent past medical history    PMB (postmenopausal bleeding)    H/O   Seasonal allergies    Past Surgical History:  Procedure Laterality Date   BUNIONECTOMY  02/25/2012   ENDOMETRIAL BIOPSY     HYSTEROSCOPY  11/12   WITH RESECTION D & C   MYOMECTOMY     WISDOM TOOTH EXTRACTION     Patient Active Problem List   Diagnosis Date Noted   Left leg numbness 10/20/2017   Stress fracture of lower leg 06/11/2017   Osteopenia 03/03/2017   Chest pain 11/13/2014   Routine general medical examination at a health care facility 05/09/2013   Need for prophylactic vaccination with combined diphtheria-tetanus-pertussis (DTP) vaccine 05/09/2013   Need for prophylactic vaccination and inoculation against other viral diseases(V04.89) 05/09/2013    PCP: Kathlynn Pardon MD   REFERRING PROVIDER: Joe Murders, DPM  REFERRING DIAG:  Diagnosis  5153226293 (ICD-10-CM) - Rupture of plantar fascia, left, subsequent encounter    THERAPY DIAG:  Pain in left foot  Difficulty in walking, not elsewhere classified  Muscle weakness (generalized)  Other symptoms and signs involving the musculoskeletal system  Rationale for Evaluation and Treatment: Rehabilitation  ONSET  DATE: February or March 2025  SUBJECTIVE:   SUBJECTIVE STATEMENT:  I exercise all the time, started having foot pain and went to the doctor. She found that the plantar fascia tore about 9mm and that the scar tissue would eventually heal up, also told me to use a golf ball to massage the area so that heals and isn't bunched up. I work out with a group and we literally do every thing. Knee hurts too, getting an MRI on the 23rd and getting a shot in it too at the end of the month. Have been dealing with knee issues since October, do lots of running and high impact work with my exercise group. Foot is MUCH better.   PERTINENT HISTORY:  See above  PAIN:  Are you having pain? No 0/10  PRECAUTIONS:  Per MD note: Recommend that she slowly progress from her cam walker to supportive sneakers with excellent arch support. She needs to avoid high-impact activities for another 3 to 4 weeks but may resume low impact activities.  RED FLAGS: None   WEIGHT BEARING RESTRICTIONS: No  FALLS:  Has patient fallen in last 6 months? No  LIVING ENVIRONMENT: Lives with: lives with their spouse Lives in: House/apartment   OCCUPATION: retired- Environmental health practitioner working for [employer]   PLOF: Independent, Independent with basic ADLs,  Independent with gait, and Independent with transfers  PATIENT GOALS: address scar tissue, be able to exercise as desired   NEXT MD VISIT: PRN with referring   OBJECTIVE:  Note: Objective measures were completed at Evaluation unless otherwise noted.  DIAGNOSTIC FINDINGS:   IMPRESSION: 1. Moderate fasciitis of the medial band of the plantar fascia, with a focal partial-thickness tear within the mid to medial aspect of the medial band of the plantar fascia, approximately 15 mm from the calcaneal origin. There is a 9 mm gap within the tear. 2. Moderate attenuation of the anterior talofibular ligament suggesting a remote partial-thickness tear. No surrounding edema. 3.  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  PATIENT SURVEYS:  FAAM 80/84, FAAM sports subscale 24/32  COGNITION: Overall cognitive status: Within functional limits for tasks assessed     SENSATION: Not tested  PALPATION  L foot plantar fascia tight and not super mobile especially close to heel   LOWER EXTREMITY ROM:  Active ROM Right eval Left eval  Hip flexion    Hip extension    Hip abduction    Hip adduction    Hip internal rotation    Hip external rotation    Knee flexion    Knee extension    Ankle dorsiflexion  14*  Ankle plantarflexion  64*  Ankle inversion  30*  Ankle eversion  20*   (Blank rows = not tested)  LOWER EXTREMITY MMT:  MMT Right eval Left eval  Hip flexion    Hip extension    Hip abduction    Hip adduction    Hip internal rotation    Hip external rotation    Knee flexion    Knee extension    Ankle dorsiflexion 5 5  Ankle plantarflexion Deferred due to knee issues  2  Ankle inversion 5 4+  Ankle eversion 5 5   (Blank rows = not tested)    FUNCTIONAL TESTS:    SLS L 30 seconds even,  deferred R due to knee issues                                                                                                                                  TREATMENT DATE:   10/06/23  Eval, POC, HEP       PATIENT EDUCATION:  Education details: see above  Person educated: Patient Education method: Explanation, Demonstration, and Handouts Education comprehension: verbalized understanding, returned demonstration, and tactile cues required  HOME EXERCISE PROGRAM:  Access Code: [email or web address] URL: [email or web address] Date: 10/06/2023 Prepared by: Terrel Ferries  Exercises - Plantar Fascia Stretch on Step  - 2 x daily - 7 x weekly - 1 sets - 3 reps - 30 seconds  hold - Seated Plantar Fascia Stretch  - 2 x daily - 7 x weekly - 1 sets - 3 reps - 30 seconds  hold - Gastroc Stretch with Foot at  Wall  - 2 x daily -  7 x weekly - 1 sets - 3 reps - 30 seconds  hold - Seated Toe Towel Scrunches  - 2 x daily - 7 x weekly - 1 sets - 10 reps - Seated Plantar Fascia Mobilization with Small Ball  - 1 x daily - 7 x weekly - 1 sets - 5 minutes  hold - Calf Mobilization with Small Ball  - 1 x daily - 7 x weekly - 1 sets - 1 reps - 5 minutes  hold  ASSESSMENT:  CLINICAL IMPRESSION: Patient is a 73 y.o. F who was seen today for physical therapy evaluation and treatment for  Diagnosis  S93.692D (ICD-10-CM) - Rupture of plantar fascia, left, subsequent encounter  . Of note she is very active and exercises regularly multiple times and is really looking forward to getting back to running/jumping. Does have quite a lot of thick tissue and scarring in L plantar fascia. Sounds to have healed very well and should do well with a bit of help from skilled PT services to help loosen scar tissue restrictions and assist in safe return to desired exercise activities.   OBJECTIVE IMPAIRMENTS: Abnormal gait, decreased activity tolerance, decreased strength, increased fascial restrictions, and impaired flexibility.   ACTIVITY LIMITATIONS: squatting, transfers, and locomotion level  PARTICIPATION LIMITATIONS: shopping, community activity, and yard work  PERSONAL FACTORS: Fitness and Time since onset of injury/illness/exacerbation are also affecting patient's functional outcome.   REHAB POTENTIAL: Excellent  CLINICAL DECISION MAKING: Stable/uncomplicated  EVALUATION COMPLEXITY: Low   GOALS: Goals reviewed with patient? No  SHORT TERM GOALS: Target date: 10/20/2023   Will be compliant with appropriate progressive HEP  Baseline: Goal status: INITIAL    LONG TERM GOALS: Target date: 11/17/2023    MMT in gastroc L to be at least 4/5 Baseline:  Goal status: INITIAL  2.  Thickness and immobility of scar tissue to have improved by 50%  Baseline:  Goal status: INITIAL  3.  Will be able to exercise as  desired without limitations from foot pain with high impact activities  Baseline:  Goal status: INITIAL  4.  FAAM sports specific scale to have improved by 6 points to show improved exercise participation Baseline:  Goal status: INITIAL    PLAN:  PT FREQUENCY: 1x/week  PT DURATION: 6 weeks  PLANNED INTERVENTIONS: 97750- Physical Performance Testing, 97110-Therapeutic exercises, 97530- Therapeutic activity, [id number]- Neuromuscular re-education, 97535- Self Care, 16109- Manual therapy, [id number]- Gait training, and [phone]- Ultrasound  PLAN FOR NEXT SESSION: scar massage/manual as appropriate, gait training, ankle strength   Terrel Ferries, PT, DPT 10/06/23 1:09 PM

## 2023-10-13 ENCOUNTER — Encounter: Payer: Self-pay | Admitting: Physical Therapy

## 2023-10-13 ENCOUNTER — Ambulatory Visit: Admitting: Physical Therapy

## 2023-10-13 DIAGNOSIS — M79672 Pain in left foot: Secondary | ICD-10-CM

## 2023-10-13 DIAGNOSIS — R262 Difficulty in walking, not elsewhere classified: Secondary | ICD-10-CM

## 2023-10-13 DIAGNOSIS — M6281 Muscle weakness (generalized): Secondary | ICD-10-CM

## 2023-10-13 NOTE — Therapy (Signed)
 OUTPATIENT PHYSICAL THERAPY LOWER EXTREMITY TREATMENT   Patient Name: Melissa Buchanan MRN: [id number] DOB:1950/09/04, 73 y.o., female Today's Date: 10/13/2023  END OF SESSION:  PT End of Session - 10/13/23 0808     Visit Number 2    Number of Visits 7    Date for PT Re-Evaluation 11/17/23    Authorization Type [employer]    Progress Note Due on Visit 10    PT Start Time 0805    PT Stop Time 0843    PT Time Calculation (min) 38 min    Activity Tolerance Patient tolerated treatment well    Behavior During Therapy WFL for tasks assessed/performed              Past Medical History:  Diagnosis Date   Chicken pox    73 years old   Fibroids    H/O   Migraines    73 years old   No pertinent past medical history    PMB (postmenopausal bleeding)    H/O   Seasonal allergies    Past Surgical History:  Procedure Laterality Date   BUNIONECTOMY  02/25/2012   ENDOMETRIAL BIOPSY     HYSTEROSCOPY  11/12   WITH RESECTION D & C   MYOMECTOMY     WISDOM TOOTH EXTRACTION     Patient Active Problem List   Diagnosis Date Noted   Left leg numbness 10/20/2017   Stress fracture of lower leg 06/11/2017   Osteopenia 03/03/2017   Chest pain 11/13/2014   Routine general medical examination at a health care facility 05/09/2013   Need for prophylactic vaccination with combined diphtheria-tetanus-pertussis (DTP) vaccine 05/09/2013   Need for prophylactic vaccination and inoculation against other viral diseases(V04.89) 05/09/2013    PCP: Kathlynn Pardon MD   REFERRING PROVIDER: Joe Murders, DPM  REFERRING DIAG:  Diagnosis  [id number] (ICD-10-CM) - Rupture of plantar fascia, left, subsequent encounter    THERAPY DIAG:  Pain in left foot  Difficulty in walking, not elsewhere classified  Muscle weakness (generalized)  Rationale for Evaluation and Treatment: Rehabilitation  ONSET DATE: February or March 2025  SUBJECTIVE:   SUBJECTIVE STATEMENT:  Stopped wearing the  compression socks, I can tell when I need it and when I don't. Have been wearing heels a bit, she told me to do that too to help stretch it.      EVAL: I exercise all the time, started having foot pain and went to the doctor. She found that the plantar fascia tore about 9mm and that the scar tissue would eventually heal up, also told me to use a golf ball to massage the area so that heals and isn't bunched up. I work out with a group and we literally do every thing. Knee hurts too, getting an MRI on the 23rd and getting a shot in it too at the end of the month. Have been dealing with knee issues since October, do lots of running and high impact work with my exercise group. Foot is MUCH better.   PERTINENT HISTORY:  See above  PAIN:  Are you having pain? No 0/10 now, occasional heel twinge   PRECAUTIONS:  Per MD note: Recommend that she slowly progress from her cam walker to supportive sneakers with excellent arch support. She needs to avoid high-impact activities for another 3 to 4 weeks but may resume low impact activities.  RED FLAGS: None   WEIGHT BEARING RESTRICTIONS: No  FALLS:  Has patient fallen in last 6 months? No  LIVING ENVIRONMENT: Lives with: lives with their spouse Lives in: House/apartment   OCCUPATION: retired- Environmental health practitioner working for [employer]   PLOF: Independent, Independent with basic ADLs, Independent with gait, and Independent with transfers  PATIENT GOALS: address scar tissue, be able to exercise as desired   NEXT MD VISIT: PRN with referring   OBJECTIVE:  Note: Objective measures were completed at Evaluation unless otherwise noted.  DIAGNOSTIC FINDINGS:   IMPRESSION: 1. Moderate fasciitis of the medial band of the plantar fascia, with a focal partial-thickness tear within the mid to medial aspect of the medial band of the plantar fascia, approximately 15 mm from the calcaneal origin. There is a 9 mm gap within the tear. 2. Moderate  attenuation of the anterior talofibular ligament suggesting a remote partial-thickness tear. No surrounding edema. 3. 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  PATIENT SURVEYS:  FAAM 80/84, FAAM sports subscale 24/32  COGNITION: Overall cognitive status: Within functional limits for tasks assessed     SENSATION: Not tested  PALPATION  L foot plantar fascia tight and not super mobile especially close to heel   LOWER EXTREMITY ROM:  Active ROM Right eval Left eval  Hip flexion    Hip extension    Hip abduction    Hip adduction    Hip internal rotation    Hip external rotation    Knee flexion    Knee extension    Ankle dorsiflexion  14*  Ankle plantarflexion  64*  Ankle inversion  30*  Ankle eversion  20*   (Blank rows = not tested)  LOWER EXTREMITY MMT:  MMT Right eval Left eval  Hip flexion    Hip extension    Hip abduction    Hip adduction    Hip internal rotation    Hip external rotation    Knee flexion    Knee extension    Ankle dorsiflexion 5 5  Ankle plantarflexion Deferred due to knee issues  2  Ankle inversion 5 4+  Ankle eversion 5 5   (Blank rows = not tested)    FUNCTIONAL TESTS:    SLS L 30 seconds even,  deferred R due to knee issues                                                                                                                                  TREATMENT DATE:     10/13/23  US  to L plantar fascia minutes head warmer on skin in good condition after tx   STM to L plantar fascia- aggressive cross friction massage  Education on scar tissue and differences in this tissue's pliability/mobility as compared to regular soft tissues, how heat and manual techniques play into forcing scar tissue to adapt/improve its mobility   Ankle PF/DF on rocker board with holds at end range x20 Double up/L down calf raises x10    10/06/23  Eval, POC, HEP  PATIENT EDUCATION:  Education  details: see above  Person educated: Patient Education method: Explanation, Demonstration, and Handouts Education comprehension: verbalized understanding, returned demonstration, and tactile cues required  HOME EXERCISE PROGRAM:  Access Code: [email or web address] URL: [email or web address] Date: 10/06/2023 Prepared by: Terrel Ferries  Exercises - Plantar Fascia Stretch on Step  - 2 x daily - 7 x weekly - 1 sets - 3 reps - 30 seconds  hold - Seated Plantar Fascia Stretch  - 2 x daily - 7 x weekly - 1 sets - 3 reps - 30 seconds  hold - Gastroc Stretch with Foot at Wall  - 2 x daily - 7 x weekly - 1 sets - 3 reps - 30 seconds  hold - Seated Toe Towel Scrunches  - 2 x daily - 7 x weekly - 1 sets - 10 reps - Seated Plantar Fascia Mobilization with Small Ball  - 1 x daily - 7 x weekly - 1 sets - 5 minutes  hold - Calf Mobilization with Small Ball  - 1 x daily - 7 x weekly - 1 sets - 1 reps - 5 minutes  hold  ASSESSMENT:  CLINICAL IMPRESSION:   Pt arrives today doing well, foot is feeling well. Used some US  and followed this with soft tissue work and functional stretches for her foot/ankle. Doing well. Will continue. Tissues had a great response to manual interventions today, much less dense and more mobile after tx.     EVAL: Patient is a 73 y.o. F who was seen today for physical therapy evaluation and treatment for  Diagnosis  S93.692D (ICD-10-CM) - Rupture of plantar fascia, left, subsequent encounter  . Of note she is very active and exercises regularly multiple times and is really looking forward to getting back to running/jumping. Does have quite a lot of thick tissue and scarring in L plantar fascia. Sounds to have healed very well and should do well with a bit of help from skilled PT services to help loosen scar tissue restrictions and assist in safe return to desired exercise activities.   OBJECTIVE IMPAIRMENTS: Abnormal gait, decreased activity tolerance, decreased strength,  increased fascial restrictions, and impaired flexibility.   ACTIVITY LIMITATIONS: squatting, transfers, and locomotion level  PARTICIPATION LIMITATIONS: shopping, community activity, and yard work  PERSONAL FACTORS: Fitness and Time since onset of injury/illness/exacerbation are also affecting patient's functional outcome.   REHAB POTENTIAL: Excellent  CLINICAL DECISION MAKING: Stable/uncomplicated  EVALUATION COMPLEXITY: Low   GOALS: Goals reviewed with patient? No  SHORT TERM GOALS: Target date: 10/20/2023   Will be compliant with appropriate progressive HEP  Baseline: Goal status: INITIAL    LONG TERM GOALS: Target date: 11/17/2023    MMT in gastroc L to be at least 4/5 Baseline:  Goal status: INITIAL  2.  Thickness and immobility of scar tissue to have improved by 50%  Baseline:  Goal status: INITIAL  3.  Will be able to exercise as desired without limitations from foot pain with high impact activities  Baseline:  Goal status: INITIAL  4.  FAAM sports specific scale to have improved by 6 points to show improved exercise participation Baseline:  Goal status: INITIAL    PLAN:  PT FREQUENCY: 1x/week  PT DURATION: 6 weeks  PLANNED INTERVENTIONS: 97750- Physical Performance Testing, 97110-Therapeutic exercises, 97530- Therapeutic activity, [id number]- Neuromuscular re-education, 97535- Self Care, 16109- Manual therapy, [id number]- Gait training, and [id number]- Ultrasound  PLAN FOR NEXT SESSION: scar massage/manual as appropriate, gait training, ankle strength, was US  helpful?  Terrel Ferries, PT, DPT 10/13/23 8:44 AM

## 2023-10-20 ENCOUNTER — Ambulatory Visit: Admitting: Physical Therapy

## 2023-10-20 ENCOUNTER — Encounter: Payer: Self-pay | Admitting: Physical Therapy

## 2023-10-20 DIAGNOSIS — R29898 Other symptoms and signs involving the musculoskeletal system: Secondary | ICD-10-CM

## 2023-10-20 DIAGNOSIS — R262 Difficulty in walking, not elsewhere classified: Secondary | ICD-10-CM

## 2023-10-20 DIAGNOSIS — M79672 Pain in left foot: Secondary | ICD-10-CM | POA: Diagnosis not present

## 2023-10-20 DIAGNOSIS — M6281 Muscle weakness (generalized): Secondary | ICD-10-CM

## 2023-10-20 NOTE — Therapy (Signed)
 OUTPATIENT PHYSICAL THERAPY LOWER EXTREMITY TREATMENT   Patient Name: Melissa Buchanan MRN: [id number] DOB:[birth year], 73 y.o., female Today's Date: 10/20/2023  END OF SESSION:  PT End of Session - 10/20/23 0831     Visit Number 3    Number of Visits 7    Date for PT Re-Evaluation 11/17/23    Authorization Type [employer]    Progress Note Due on Visit 10    PT Start Time 0805    PT Stop Time [phone]    PT Time Calculation (min) 38 min    Activity Tolerance Patient tolerated treatment well    Behavior During Therapy WFL for tasks assessed/performed               Past Medical History:  Diagnosis Date   Chicken pox    73 years old   Fibroids    H/O   Migraines    73 years old   No pertinent past medical history    PMB (postmenopausal bleeding)    H/O   Seasonal allergies    Past Surgical History:  Procedure Laterality Date   BUNIONECTOMY  02/25/2012   ENDOMETRIAL BIOPSY     HYSTEROSCOPY  11/12   WITH RESECTION D & C   MYOMECTOMY     WISDOM TOOTH EXTRACTION     Patient Active Problem List   Diagnosis Date Noted   Left leg numbness 10/20/2017   Stress fracture of lower leg 06/11/2017   Osteopenia 03/03/2017   Chest pain 11/13/2014   Routine general medical examination at a health care facility 05/09/2013   Need for prophylactic vaccination with combined diphtheria-tetanus-pertussis (DTP) vaccine 05/09/2013   Need for prophylactic vaccination and inoculation against other viral diseases(V04.89) 05/09/2013    PCP: Kathlynn Pardon MD   REFERRING PROVIDER: Joe Murders, DPM  REFERRING DIAG:  Diagnosis  [phone] (ICD-10-CM) - Rupture of plantar fascia, left, subsequent encounter    THERAPY DIAG:  Pain in left foot  Difficulty in walking, not elsewhere classified  Muscle weakness (generalized)  Other symptoms and signs involving the musculoskeletal system  Rationale for Evaluation and Treatment: Rehabilitation  ONSET DATE: February or March  2025  SUBJECTIVE:   SUBJECTIVE STATEMENT:  Foot is feeling really good, stopped wearing sleeve except for when I exercise. I'm very close to where I'd like to be now, I can still feel were the scar is but its much better. Was able to wear sandals with some arch support yesterday, felt really good definitely getting better. Did some regular jacks last week (about 30 didn't want to go too crazy) but it felt good      EVAL: I exercise all the time, started having foot pain and went to the doctor. She found that the plantar fascia tore about 9mm and that the scar tissue would eventually heal up, also told me to use a golf ball to massage the area so that heals and isn't bunched up. I work out with a group and we literally do every thing. Knee hurts too, getting an MRI on the 23rd and getting a shot in it too at the end of the month. Have been dealing with knee issues since October, do lots of running and high impact work with my exercise group. Foot is MUCH better.   PERTINENT HISTORY:  See above  PAIN:  Are you having pain? No 0/10  PRECAUTIONS:  Per MD note: Recommend that she slowly progress from her cam walker to supportive sneakers with excellent arch  support. She needs to avoid high-impact activities for another 3 to 4 weeks but may resume low impact activities.  RED FLAGS: None   WEIGHT BEARING RESTRICTIONS: No  FALLS:  Has patient fallen in last 6 months? No  LIVING ENVIRONMENT: Lives with: lives with their spouse Lives in: House/apartment   OCCUPATION: retired- Environmental health practitioner working for [employer]   PLOF: Independent, Independent with basic ADLs, Independent with gait, and Independent with transfers  PATIENT GOALS: address scar tissue, be able to exercise as desired   NEXT MD VISIT: PRN with referring   OBJECTIVE:  Note: Objective measures were completed at Evaluation unless otherwise noted.  DIAGNOSTIC FINDINGS:   IMPRESSION: 1. Moderate fasciitis of the  medial band of the plantar fascia, with a focal partial-thickness tear within the mid to medial aspect of the medial band of the plantar fascia, approximately 15 mm from the calcaneal origin. There is a 9 mm gap within the tear. 2. Moderate attenuation of the anterior talofibular ligament suggesting a remote partial-thickness tear. No surrounding edema. 3. 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  PATIENT SURVEYS:  FAAM 80/84, FAAM sports subscale 24/32  COGNITION: Overall cognitive status: Within functional limits for tasks assessed     SENSATION: Not tested  PALPATION  L foot plantar fascia tight and not super mobile especially close to heel   LOWER EXTREMITY ROM:  Active ROM Right eval Left eval  Hip flexion    Hip extension    Hip abduction    Hip adduction    Hip internal rotation    Hip external rotation    Knee flexion    Knee extension    Ankle dorsiflexion  14*  Ankle plantarflexion  64*  Ankle inversion  30*  Ankle eversion  20*   (Blank rows = not tested)  LOWER EXTREMITY MMT:  MMT Right eval Left eval  Hip flexion    Hip extension    Hip abduction    Hip adduction    Hip internal rotation    Hip external rotation    Knee flexion    Knee extension    Ankle dorsiflexion 5 5  Ankle plantarflexion Deferred due to knee issues  2  Ankle inversion 5 4+  Ankle eversion 5 5   (Blank rows = not tested)    FUNCTIONAL TESTS:    SLS L 30 seconds even,  deferred R due to knee issues                                                                                                                                  TREATMENT DATE:    10/20/23  US  to L plantar fascia minutes head warmer on skin in good condition after tx  STM to L plantar fascia- aggressive cross friction massage   Gastroc stretches on doorframe L 2x30 seconds Double up/L eccentric control down heel raises x10 Heel walks x3 rounds Toe  walks  x3 rounds Eccentric plantar fascia strengthening on step x10     10/13/23  US  to L plantar fascia minutes head warmer on skin in good condition after tx   STM to L plantar fascia- aggressive cross friction massage  Education on scar tissue and differences in this tissue's pliability/mobility as compared to regular soft tissues, how heat and manual techniques play into forcing scar tissue to adapt/improve its mobility   Ankle PF/DF on rocker board with holds at end range x20 Double up/L down calf raises x10    10/06/23  Eval, POC, HEP       PATIENT EDUCATION:  Education details: see above  Person educated: Patient Education method: Explanation, Demonstration, and Handouts Education comprehension: verbalized understanding, returned demonstration, and tactile cues required  HOME EXERCISE PROGRAM:  Access Code: [email or web address] URL: [email or web address] Date: 10/20/2023 Prepared by: Terrel Ferries  Exercises - Plantar Fascia Stretch on Step  - 2 x daily - 7 x weekly - 1 sets - 3 reps - 30 seconds  hold - Seated Plantar Fascia Stretch  - 2 x daily - 7 x weekly - 1 sets - 3 reps - 30 seconds  hold - Gastroc Stretch with Foot at Wall  - 2 x daily - 7 x weekly - 1 sets - 3 reps - 30 seconds  hold - Seated Toe Towel Scrunches  - 2 x daily - 7 x weekly - 1 sets - 10 reps - Seated Plantar Fascia Mobilization with Small Ball  - 1 x daily - 7 x weekly - 1 sets - 5 minutes  hold - Calf Mobilization with Small Ball  - 1 x daily - 7 x weekly - 1 sets - 1 reps - 5 minutes  hold - Standing Heel Raise  - 1 x daily - 7 x weekly - 1-2 sets - 10 reps - Toe Walking  - 1 x daily - 7 x weekly - 1 sets - 3 reps - Heel Walking  - 1 x daily - 7 x weekly - 1 sets - 3 reps - Eccentric Plantar Fascia Strengthening on Step  - 1 x daily - 7 x weekly - 3 sets - 10 reps - Eccentric Plantar Fascia Strengthening on Step  - 1 x daily - 7 x weekly - 1 sets - 10 reps     ASSESSMENT:  CLINICAL  IMPRESSION:   Pt arrives today doing very well, sounds like her foot is getting better and better. Continued with US  and manual interventions, also progressed with functional exercises and progressed HEP today. Her main concern is her knee right now, getting MRI on Friday. Will continue may be ready for DC soon.     EVAL: Patient is a 73 y.o. F who was seen today for physical therapy evaluation and treatment for  Diagnosis  S93.692D (ICD-10-CM) - Rupture of plantar fascia, left, subsequent encounter  . Of note she is very active and exercises regularly multiple times and is really looking forward to getting back to running/jumping. Does have quite a lot of thick tissue and scarring in L plantar fascia. Sounds to have healed very well and should do well with a bit of help from skilled PT services to help loosen scar tissue restrictions and assist in safe return to desired exercise activities.   OBJECTIVE IMPAIRMENTS: Abnormal gait, decreased activity tolerance, decreased strength, increased fascial restrictions, and impaired flexibility.   ACTIVITY LIMITATIONS: squatting, transfers, and locomotion level  PARTICIPATION LIMITATIONS: shopping, community activity, and  yard work  PERSONAL FACTORS: Fitness and Time since onset of injury/illness/exacerbation are also affecting patient's functional outcome.   REHAB POTENTIAL: Excellent  CLINICAL DECISION MAKING: Stable/uncomplicated  EVALUATION COMPLEXITY: Low   GOALS: Goals reviewed with patient? No  SHORT TERM GOALS: Target date: 10/20/2023   Will be compliant with appropriate progressive HEP  Baseline: Goal status: MET 10/20/23    LONG TERM GOALS: Target date: 11/17/2023    MMT in gastroc L to be at least 4/5 Baseline:  Goal status: INITIAL  2.  Thickness and immobility of scar tissue to have improved by 50%  Baseline:  Goal status: INITIAL  3.  Will be able to exercise as desired without limitations from foot pain with high  impact activities  Baseline:  Goal status: INITIAL  4.  FAAM sports specific scale to have improved by 6 points to show improved exercise participation Baseline:  Goal status: INITIAL    PLAN:  PT FREQUENCY: 1x/week  PT DURATION: 6 weeks  PLANNED INTERVENTIONS: 97750- Physical Performance Testing, 97110-Therapeutic exercises, 97530- Therapeutic activity, [id number]- Neuromuscular re-education, 97535- Self Care, [id number]- Manual therapy, [id number]- Gait training, and [id number]- Ultrasound  PLAN FOR NEXT SESSION: scar massage/manual as appropriate, gait training, ankle strength, continue US /deep STM. Possible DC in next 1-2 visits?   Terrel Ferries, PT, DPT 10/20/23 8:45 AM

## 2023-10-29 ENCOUNTER — Ambulatory Visit: Admitting: Physical Therapy

## 2023-10-29 ENCOUNTER — Encounter: Payer: Self-pay | Admitting: Physical Therapy

## 2023-10-29 DIAGNOSIS — R262 Difficulty in walking, not elsewhere classified: Secondary | ICD-10-CM

## 2023-10-29 DIAGNOSIS — M79672 Pain in left foot: Secondary | ICD-10-CM

## 2023-10-29 DIAGNOSIS — M6281 Muscle weakness (generalized): Secondary | ICD-10-CM

## 2023-10-29 DIAGNOSIS — R29898 Other symptoms and signs involving the musculoskeletal system: Secondary | ICD-10-CM

## 2023-10-29 NOTE — Therapy (Signed)
 OUTPATIENT PHYSICAL THERAPY LOWER EXTREMITY DISCHARGE   Patient Name: Melissa Buchanan MRN: [id number] DOB:08-08-1950, 73 y.o., female Today's Date: 10/29/2023   PHYSICAL THERAPY DISCHARGE SUMMARY  Visits from Start of Care: 4  Current functional level related to goals / functional outcomes: See below    Remaining deficits: See below    Education / Equipment: See below    Patient agrees to discharge. Patient goals were partially met. Patient is being discharged due to being pleased with the current functional level.   END OF SESSION:  PT End of Session - 10/29/23 0814     Visit Number 4    Number of Visits 7    Date for PT Re-Evaluation 11/17/23    Authorization Type [employer]    Progress Note Due on Visit 10    PT Start Time 0803    PT Stop Time [phone]    PT Time Calculation (min) 40 min    Activity Tolerance Patient tolerated treatment well    Behavior During Therapy WFL for tasks assessed/performed                Past Medical History:  Diagnosis Date   Chicken pox    73 years old   Fibroids    H/O   Migraines    73 years old   No pertinent past medical history    PMB (postmenopausal bleeding)    H/O   Seasonal allergies    Past Surgical History:  Procedure Laterality Date   BUNIONECTOMY  02/25/2012   ENDOMETRIAL BIOPSY     HYSTEROSCOPY  11/12   WITH RESECTION D & C   MYOMECTOMY     WISDOM TOOTH EXTRACTION     Patient Active Problem List   Diagnosis Date Noted   Left leg numbness 10/20/2017   Stress fracture of lower leg 06/11/2017   Osteopenia 03/03/2017   Chest pain 11/13/2014   Routine general medical examination at a health care facility 05/09/2013   Need for prophylactic vaccination with combined diphtheria-tetanus-pertussis (DTP) vaccine 05/09/2013   Need for prophylactic vaccination and inoculation against other viral diseases(V04.89) 05/09/2013    PCP: Kathlynn Pardon MD   REFERRING PROVIDER: Joe Murders, DPM  REFERRING DIAG:   Diagnosis  [phone] (ICD-10-CM) - Rupture of plantar fascia, left, subsequent encounter    THERAPY DIAG:  Pain in left foot  Difficulty in walking, not elsewhere classified  Muscle weakness (generalized)  Other symptoms and signs involving the musculoskeletal system  Rationale for Evaluation and Treatment: Rehabilitation  ONSET DATE: February or March 2025  SUBJECTIVE:   SUBJECTIVE STATEMENT:  Foot is feeling amazing, big problem is my knee- have appointments to discuss MRI and get the gel shot done Friday. Workouts have been going well in terms of the foot.     EVAL: I exercise all the time, started having foot pain and went to the doctor. She found that the plantar fascia tore about 9mm and that the scar tissue would eventually heal up, also told me to use a golf ball to massage the area so that heals and isn't bunched up. I work out with a group and we literally do every thing. Knee hurts too, getting an MRI on the 23rd and getting a shot in it too at the end of the month. Have been dealing with knee issues since October, do lots of running and high impact work with my exercise group. Foot is MUCH better.   PERTINENT HISTORY:  See above  PAIN:  Are you having pain? No 0/10 today  PRECAUTIONS:  Per MD note: Recommend that she slowly progress from her cam walker to supportive sneakers with excellent arch support. She needs to avoid high-impact activities for another 3 to 4 weeks but may resume low impact activities.  RED FLAGS: None   WEIGHT BEARING RESTRICTIONS: No  FALLS:  Has patient fallen in last 6 months? No  LIVING ENVIRONMENT: Lives with: lives with their spouse Lives in: House/apartment   OCCUPATION: retired- Environmental health practitioner working for [employer]   PLOF: Independent, Independent with basic ADLs, Independent with gait, and Independent with transfers  PATIENT GOALS: address scar tissue, be able to exercise as desired   NEXT MD VISIT: PRN with  referring   OBJECTIVE:  Note: Objective measures were completed at Evaluation unless otherwise noted.  DIAGNOSTIC FINDINGS:   IMPRESSION: 1. Moderate fasciitis of the medial band of the plantar fascia, with a focal partial-thickness tear within the mid to medial aspect of the medial band of the plantar fascia, approximately 15 mm from the calcaneal origin. There is a 9 mm gap within the tear. 2. Moderate attenuation of the anterior talofibular ligament suggesting a remote partial-thickness tear. No surrounding edema. 3. 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  PATIENT SURVEYS:  FAAM 80/84, FAAM sports subscale 24/32; 10/29/23 31/32  COGNITION: Overall cognitive status: Within functional limits for tasks assessed     SENSATION: Not tested  PALPATION  L foot plantar fascia tight and not super mobile especially close to heel   LOWER EXTREMITY ROM:  Active ROM Right eval Left eval Left 10/29/23  Hip flexion     Hip extension     Hip abduction     Hip adduction     Hip internal rotation     Hip external rotation     Knee flexion     Knee extension     Ankle dorsiflexion  14* 17*  Ankle plantarflexion  64* 75*  Ankle inversion  30* 28*  Ankle eversion  20* 23*   (Blank rows = not tested)  LOWER EXTREMITY MMT:  MMT Right eval Left eval Left 10/29/23  Hip flexion     Hip extension     Hip abduction     Hip adduction     Hip internal rotation     Hip external rotation     Knee flexion     Knee extension     Ankle dorsiflexion 5 5   Ankle plantarflexion Deferred due to knee issues  2 2  Ankle inversion 5 4+   Ankle eversion 5 5    (Blank rows = not tested)    FUNCTIONAL TESTS:    SLS L 30 seconds even,  deferred R due to knee issues  TREATMENT DATE:   10/29/23  MMT, ROM, goal check for  DC, continued progression of exercises/jumping activities in moderation, education on PT results and exercise moving forward, self foot massage/where to focus for best results   US  to L plantar fascia minutes head warmer on skin in good condition after tx  STM to L plantar fascia- aggressive cross friction massage    10/20/23  US  to L plantar fascia minutes head warmer on skin in good condition after tx  STM to L plantar fascia- aggressive cross friction massage   Gastroc stretches on doorframe L 2x30 seconds Double up/L eccentric control down heel raises x10 Heel walks x3 rounds Toe walks x3 rounds Eccentric plantar fascia strengthening on step x10     10/13/23  US  to L plantar fascia minutes head warmer on skin in good condition after tx   STM to L plantar fascia- aggressive cross friction massage  Education on scar tissue and differences in this tissue's pliability/mobility as compared to regular soft tissues, how heat and manual techniques play into forcing scar tissue to adapt/improve its mobility   Ankle PF/DF on rocker board with holds at end range x20 Double up/L down calf raises x10    10/06/23  Eval, POC, HEP       PATIENT EDUCATION:  Education details: see above  Person educated: Patient Education method: Explanation, Demonstration, and Handouts Education comprehension: verbalized understanding, returned demonstration, and tactile cues required  HOME EXERCISE PROGRAM:  Access Code: [email or web address] URL: [email or web address] Date: 10/20/2023 Prepared by: Terrel Ferries  Exercises - Plantar Fascia Stretch on Step  - 2 x daily - 7 x weekly - 1 sets - 3 reps - 30 seconds  hold - Seated Plantar Fascia Stretch  - 2 x daily - 7 x weekly - 1 sets - 3 reps - 30 seconds  hold - Gastroc Stretch with Foot at Wall  - 2 x daily - 7 x weekly - 1 sets - 3 reps - 30 seconds  hold - Seated Toe Towel Scrunches  - 2 x daily - 7 x weekly - 1 sets - 10 reps -  Seated Plantar Fascia Mobilization with Small Ball  - 1 x daily - 7 x weekly - 1 sets - 5 minutes  hold - Calf Mobilization with Small Ball  - 1 x daily - 7 x weekly - 1 sets - 1 reps - 5 minutes  hold - Standing Heel Raise  - 1 x daily - 7 x weekly - 1-2 sets - 10 reps - Toe Walking  - 1 x daily - 7 x weekly - 1 sets - 3 reps - Heel Walking  - 1 x daily - 7 x weekly - 1 sets - 3 reps - Eccentric Plantar Fascia Strengthening on Step  - 1 x daily - 7 x weekly - 3 sets - 10 reps - Eccentric Plantar Fascia Strengthening on Step  - 1 x daily - 7 x weekly - 1 sets - 10 reps     ASSESSMENT:  CLINICAL IMPRESSION:   Doing very well and has met the majority of her goals with the exception of gastroc strength. Continued with manual interventions and discharged today as she is generally at desired level of function with exception of her knee pain- getting gel shot for her knee soon. DC today thank you for the referral!     EVAL: Patient is a 73 y.o. F who was seen today for physical therapy evaluation and treatment for  Diagnosis  S93.692D (ICD-10-CM) - Rupture of plantar fascia, left, subsequent encounter  . Of note she is very active and exercises regularly multiple times and is really looking forward to getting back to running/jumping. Does have quite a lot of thick tissue and scarring in L plantar fascia. Sounds to have healed very well and should do well with a bit of help from skilled PT services to help loosen scar tissue restrictions and assist in safe return to desired exercise activities.   OBJECTIVE IMPAIRMENTS: Abnormal gait, decreased activity tolerance, decreased strength, increased fascial restrictions, and impaired flexibility.   ACTIVITY LIMITATIONS: squatting, transfers, and locomotion level  PARTICIPATION LIMITATIONS: shopping, community activity, and yard work  PERSONAL FACTORS: Fitness and Time since onset of injury/illness/exacerbation are also affecting patient's functional  outcome.   REHAB POTENTIAL: Excellent  CLINICAL DECISION MAKING: Stable/uncomplicated  EVALUATION COMPLEXITY: Low   GOALS: Goals reviewed with patient? No  SHORT TERM GOALS: Target date: 10/20/2023   Will be compliant with appropriate progressive HEP  Baseline: Goal status: MET 10/20/23    LONG TERM GOALS: Target date: 11/17/2023    MMT in gastroc L to be at least 4/5 Baseline:  Goal status: NOT MET 10/29/23  2.  Thickness and immobility of scar tissue to have improved by 50%  Baseline:  Goal status: MET 10/29/23  3.  Will be able to exercise as desired without limitations from foot pain with high impact activities  Baseline:  Goal status: MET 10/29/23  4.  FAAM sports specific scale to have improved by 6 points to show improved exercise participation Baseline:  Goal status: MET 10/29/23    PLAN:  PT FREQUENCY: 1x/week  PT DURATION: 6 weeks  PLANNED INTERVENTIONS: 97750- Physical Performance Testing, 97110-Therapeutic exercises, 97530- Therapeutic activity, [id number]- Neuromuscular re-education, 97535- Self Care, [id number]- Manual therapy, [phone]- Gait training, and [phone]- Ultrasound  PLAN FOR NEXT SESSION: DC today   Terrel Ferries, PT, DPT 10/29/23 8:47 AM

## 2023-10-31 DIAGNOSIS — M7121 Synovial cyst of popliteal space [Baker], right knee: Secondary | ICD-10-CM | POA: Insufficient documentation

## 2023-10-31 DIAGNOSIS — M1711 Unilateral primary osteoarthritis, right knee: Secondary | ICD-10-CM | POA: Insufficient documentation

## 2024-02-23 ENCOUNTER — Other Ambulatory Visit: Payer: Self-pay

## 2024-02-23 ENCOUNTER — Emergency Department (HOSPITAL_BASED_OUTPATIENT_CLINIC_OR_DEPARTMENT_OTHER)
Admission: EM | Admit: 2024-02-23 | Discharge: 2024-02-23 | Disposition: A | Discharge status: Home / Self Care | Attending: Emergency Medicine | Admitting: Emergency Medicine

## 2024-02-23 ENCOUNTER — Encounter (HOSPITAL_BASED_OUTPATIENT_CLINIC_OR_DEPARTMENT_OTHER): Payer: Self-pay

## 2024-02-23 DIAGNOSIS — R21 Rash and other nonspecific skin eruption: Secondary | ICD-10-CM | POA: Diagnosis present

## 2024-02-23 MED ORDER — CEPHALEXIN 500 MG PO CAPS: 500.0000 mg | ORAL_CAPSULE | Freq: Two times a day (BID) | ORAL | 0 refills | Status: AC

## 2024-02-23 NOTE — Discharge Instructions (Signed)
 You were seen in the emerged from today with leg rash.  I am starting you on an antibiotic for the next week.  Please continue to monitor your symptoms.  If it becomes painful or blistering you should return for additional treatment.  Please follow closely with your primary care doctor.

## 2024-02-23 NOTE — ED Provider Notes (Signed)
 Emergency Department Provider Note   I have reviewed the triage vital signs and the nursing notes.   HISTORY  Chief Complaint Rash   HPI Melissa Buchanan is a 73 y.o. female with past history reviewed below presents to the emergency department with left calf rash.  The rash is not painful or itchy.  No blistering.  No injury.  No pain in the legs or swelling.  Patient states it began as a small spot and then rapidly spread.    Past Medical History:  Diagnosis Date   Chicken pox    73 years old   Fibroids    H/O   Migraines    73 years old   No pertinent past medical history    PMB (postmenopausal bleeding)    H/O   Seasonal allergies     Review of Systems  Constitutional: No fever/chills Cardiovascular: Denies chest pain. Respiratory: Denies shortness of breath. Gastrointestinal: No abdominal pain.   Skin: Positive rash.  Neurological: Negative for headaches.  ____________________________________________   PHYSICAL EXAM:  VITAL SIGNS: ED Triage Vitals  Encounter Vitals Group     BP 02/23/24 1010 (!) 148/68     Pulse Rate 02/23/24 1010 75     Resp 02/23/24 1010 16     Temp 02/23/24 1010 97.7 F (36.5 C)     Temp Source 02/23/24 1010 Oral     SpO2 02/23/24 1010 100 %   Constitutional: Alert and oriented. Well appearing and in no acute distress. Eyes: Conjunctivae are normal.  Head: Atraumatic. Nose: No congestion/rhinnorhea. Mouth/Throat: Mucous membranes are moist.   Neck: No stridor.   Cardiovascular: Normal rate, regular rhythm. Good peripheral circulation. Grossly normal heart sounds.   Respiratory: Normal respiratory effort.  No retractions. Lungs CTAB. Gastrointestinal: Soft and nontender. No distention.  Musculoskeletal: No gross deformities of extremities. Neurologic:  Normal speech and language.  Skin:  Skin is warm, dry and intact.  Erythema to the posterior left calf without swelling.  Compartments are soft.  No petechiae.  No  blistering.    ____________________________________________   PROCEDURES  Procedure(s) performed:   Procedures  None  ____________________________________________   INITIAL IMPRESSION / ASSESSMENT AND PLAN / ED COURSE  Pertinent labs & imaging results that were available during my care of the patient were reviewed by me and considered in my medical decision making (see chart for details).   This patient is Presenting for Evaluation of rash, which does require a range of treatment options, and is a complaint that involves a low risk of morbidity and mortality.  The Differential Diagnoses include cellulitis, contact dermatitis, allergic reaction, DVT, etc.  Medical Decision Making: Summary:  The patient presents to the emergency department with rash to the posterior calf.  Exam of the rash here today seems like possible early cellulitis.  The rash is not consistent with zoster.  No other clinical signs or symptoms to suggest DVT.  Plan for Keflex  and PCP follow-up plan.  Patient's presentation is most consistent with acute, uncomplicated illness.   Disposition: discharge  ____________________________________________  FINAL CLINICAL IMPRESSION(S) / ED DIAGNOSES  Final diagnoses:  Rash     NEW OUTPATIENT MEDICATIONS STARTED DURING THIS VISIT:  New Prescriptions   CEPHALEXIN  (KEFLEX ) 500 MG CAPSULE    Take 1 capsule (500 mg total) by mouth 2 (two) times daily for 7 days.    Note:  This document was prepared using Dragon voice recognition software and may include unintentional dictation errors.  Fonda Law,  MD, [facility]    Mumin Denomme, Fonda MATSU, MD 02/23/24 [phone]

## 2024-02-23 NOTE — ED Triage Notes (Signed)
 Reports rash on back of L calf. Denies itching, pain or new skin products

## 2024-05-05 LAB — HM DEXA SCAN

## 2024-05-17 DIAGNOSIS — M25511 Pain in right shoulder: Secondary | ICD-10-CM | POA: Insufficient documentation

## 2024-06-07 LAB — HM MAMMOGRAPHY

## 2024-06-25 ENCOUNTER — Encounter: Payer: Self-pay | Admitting: Family Medicine

## 2024-06-30 ENCOUNTER — Ambulatory Visit: Admitting: Family Medicine

## 2024-06-30 VITALS — BP 122/82 | HR 61 | Ht 63.0 in | Wt 179.0 lb

## 2024-06-30 DIAGNOSIS — Z6831 Body mass index (BMI) 31.0-31.9, adult: Secondary | ICD-10-CM | POA: Diagnosis not present

## 2024-06-30 DIAGNOSIS — E559 Vitamin D deficiency, unspecified: Secondary | ICD-10-CM | POA: Diagnosis not present

## 2024-06-30 DIAGNOSIS — E66811 Obesity, class 1: Secondary | ICD-10-CM

## 2024-06-30 DIAGNOSIS — E669 Obesity, unspecified: Secondary | ICD-10-CM | POA: Diagnosis not present

## 2024-06-30 DIAGNOSIS — R7989 Other specified abnormal findings of blood chemistry: Secondary | ICD-10-CM

## 2024-06-30 DIAGNOSIS — Z23 Encounter for immunization: Secondary | ICD-10-CM

## 2024-06-30 DIAGNOSIS — Z7689 Persons encountering health services in other specified circumstances: Secondary | ICD-10-CM

## 2024-06-30 MED ORDER — WEGOVY 1.5 MG PO TABS: 1.5000 mg | ORAL_TABLET | Freq: Every day | ORAL | 1 refills | Status: DC

## 2024-06-30 NOTE — Progress Notes (Signed)
 "  Patient Office Visit  Assessment & Plan:  Encounter to establish care -     Comprehensive metabolic panel with GFR -     Lipid panel -     CBC with Differential/Platelet -     TSH  BMI 31.0-31.9,adult -     Wegovy ; Take 1 tablet (1.5 mg total) by mouth daily. Daily in AM on an empty stomach with 4 oz of water. Do not eat or drink for 30 minutes after dose.  Dispense: 30 tablet; Refill: 1  Low vitamin D  level -     VITAMIN D  25 Hydroxy (Vit-D Deficiency, Fractures)  Need for Tdap vaccination -     Tdap vaccine greater than or equal to 7yo IM  Immunization due -     Varicella-zoster vaccine IM   Assessment and Plan    Adult Wellness Visit Routine wellness visit with no acute concerns. Echocardiogram showed good ejection fraction. Bone density normal as of December. - Checked tetanus and shingles vaccination coverage. - Scheduled follow-up in 3 months.  Obesity Difficulty losing weight despite regular exercise. Wellbutrin ineffective for weight loss. Discussed Wegovy  as an alternative, noting potential side effects. Insurance does not cover Wegovy , cost is $149/month if not covered. - Discontinued Wellbutrin. - Initiated Wegovy  for weight loss. - Ensured adequate protein intake to prevent muscle mass loss. - Sent Wegovy  prescription to pharmacy.  Vitamin D  deficiency Previously identified vitamin D  deficiency. Currently taking vitamin D  supplements. - Continue vitamin D  supplementation.      Recommend healthy diet i.e mediterranean/DASH diet, consistent exercise - 30 minutes 5 day per week, and gradual weight loss. Test results were reviewed and analyzed as part of the medical decision making of this visit.  Reviewed previous notes from [facility] and previous labs that were done in October during the office visit.  Tdap updated today and #2 Shingrix given today. Return in about 3 months (around 09/28/2024), or if symptoms worsen or fail to improve.    Subjective:    Patient ID: Melissa Buchanan, female    DOB: 08-21-1950  Age: 74 y.o. MRN: [id number]  Chief Complaint  Patient presents with   Medical Management of Chronic Issues   Establish Care    Here to re-establish care. Mammo completed on 06/07/24- waiting for report to document. Last colonoscopy in 2018- waiting on report to document.     HPI Discussed the use of AI scribe software for clinical note transcription with the patient, who gave verbal consent to proceed.      History of Present Illness Melissa Buchanan is a 74 year old female who presents for a follow-up regarding weight management and family history of amyloidosis. She is here to establish primary care in our office  She has a family history of amyloidosis, which led to the passing of her brother. Concerned about the hereditary nature of the disease, she has been proactive in seeking medical evaluation. She underwent an echocardiogram and a calcium score test, both of which were normal. She has also seen a cardiologist, and she reports that she was not told to return for follow-up.  She has been struggling with weight management, particularly with weight on her legs, despite working out for three years. She was previously prescribed Wellbutrin for weight loss, but it has not been effective. She is exploring other options for weight management, including potential medications that may not be covered by her insurance.  She is actively engaged in physical activity, participating  in group exercises, and modifies exercises as needed due to knee issues. She has received gel injections in her knee to manage discomfort. She is also working on improving her balance, which she notes has declined with age.  Her dietary habits include both home-cooked meals and meals prepared by a local cook who provides healthy options. She acknowledges a need to cut back on sweets.  Her last blood work in October showed low vitamin D  and B12  levels. She is currently taking supplements for vitamin D , B12, calcium, and biotin for hair health. Her bone density test in December was normal.  Results Labs Vitamin D  (03/2024): Low Vitamin B12 (03/2024): Low  Diagnostic Bone density (05/2024): Within normal limits Echocardiogram (03/30/2024): Normal ejection fraction Coronary artery calcium score (2020): Zero  Assessment and Plan Adult Wellness Visit Routine wellness visit with no acute concerns. Echocardiogram showed good ejection fraction. Bone density normal as of December. - Checked tetanus and shingles vaccination coverage. - Scheduled follow-up in 3 months.  Obesity Difficulty losing weight despite regular exercise. Wellbutrin ineffective for weight loss. Discussed Wegovy  as an alternative, noting potential side effects. Insurance does not cover Wegovy , cost is $149/month if not covered. - Discontinued Wellbutrin. - Initiated Wegovy  for weight loss.  We will see if it is covered by her insurance-- if not we will send it to the pharmaceutical company. - Ensured adequate protein intake to prevent muscle mass loss. - Sent Wegovy  prescription to pharmacy.  Vitamin D  deficiency Previously identified vitamin D  deficiency. Currently taking vitamin D  supplements. - Continue vitamin D  supplementation.    The 10-year ASCVD risk score (Arnett DK, et al., 2019) is: 10.7%  Past Medical History:  Diagnosis Date   Allergy    Arthritis    Chicken pox    74 years old   Fibroids    H/O   Migraines    74 years old   No pertinent past medical history    PMB (postmenopausal bleeding)    H/O   Seasonal allergies    Past Surgical History:  Procedure Laterality Date   BUNIONECTOMY  02/25/2012   ENDOMETRIAL BIOPSY     EYE SURGERY     HYSTEROSCOPY  04/04/2011   WITH RESECTION D & C   MYOMECTOMY     WISDOM TOOTH EXTRACTION     Social History[1] Family History  Problem Relation Age of Onset   Stroke Mother    Heart attack  Mother    Arthritis Mother    Cancer Mother    Heart disease Mother    Hypertension Mother    Hyperlipidemia Mother    Miscarriages / Stillbirths Mother    Cancer Father    Cancer Brother        MELANOMA   Varicose Veins Maternal Grandmother    Allergies[2]  ROS    Objective:    BP 122/82   Pulse 61   Ht 5' 3 (1.6 m)   Wt 179 lb (81.2 kg)   SpO2 98%   BMI 31.71 kg/m  BP Readings from Last 3 Encounters:  06/30/24 122/82  02/23/24 (!) 148/68  08/12/23 126/84   Wt Readings from Last 3 Encounters:  06/30/24 179 lb (81.2 kg)  08/12/23 171 lb (77.6 kg)  08/11/23 168 lb (76.2 kg)    Physical Exam Vitals and nursing note reviewed.  Constitutional:      Appearance: Normal appearance.  HENT:     Head: Normocephalic.     Right Ear: Tympanic membrane, ear  canal and external ear normal.     Left Ear: Tympanic membrane, ear canal and external ear normal.  Eyes:     Extraocular Movements: Extraocular movements intact.     Conjunctiva/sclera: Conjunctivae normal.     Pupils: Pupils are equal, round, and reactive to light.  Cardiovascular:     Rate and Rhythm: Normal rate and regular rhythm.     Heart sounds: Normal heart sounds.  Pulmonary:     Effort: Pulmonary effort is normal.     Breath sounds: Normal breath sounds.  Musculoskeletal:     Right lower leg: No edema.     Left lower leg: No edema.  Neurological:     General: No focal deficit present.     Mental Status: She is alert and oriented to person, place, and time.  Psychiatric:        Mood and Affect: Mood normal.        Behavior: Behavior normal.        Thought Content: Thought content normal.        Judgment: Judgment normal.      Results for orders placed or performed in visit on 06/30/24  HM DEXA SCAN  Result Value Ref Range   HM Dexa Scan Report in chart             [1]  Social History Tobacco Use   Smoking status: Never   Smokeless tobacco: Never  Vaping Use   Vaping status: Never  Used  Substance Use Topics   Alcohol use: Never   Drug use: Never  [2]  Allergies Allergen Reactions   Meloxicam  Nausea And Vomiting   Egg Protein-Containing Drug Products Diarrhea and Nausea And Vomiting   "

## 2024-07-01 ENCOUNTER — Ambulatory Visit: Payer: Self-pay | Admitting: Family Medicine

## 2024-07-01 ENCOUNTER — Other Ambulatory Visit: Payer: Self-pay

## 2024-07-01 ENCOUNTER — Encounter: Payer: Self-pay | Admitting: Family Medicine

## 2024-07-01 DIAGNOSIS — Z6831 Body mass index (BMI) 31.0-31.9, adult: Secondary | ICD-10-CM

## 2024-07-01 LAB — CBC WITH DIFFERENTIAL/PLATELET
Absolute Lymphocytes: 2042 {cells}/uL (ref 850–3900)
Absolute Monocytes: 669 {cells}/uL (ref 200–950)
Basophils Absolute: 26 {cells}/uL (ref 0–200)
Basophils Relative: 0.3 %
Eosinophils Absolute: 26 {cells}/uL (ref 15–500)
Eosinophils Relative: 0.3 %
HCT: 41.2 % (ref 35.9–46.0)
Hemoglobin: 13.3 g/dL (ref 11.7–15.5)
MCH: 30.4 pg (ref 27.0–33.0)
MCHC: 32.3 g/dL (ref 31.6–35.4)
MCV: 94.3 fL (ref 81.4–101.7)
MPV: 10.6 fL (ref 7.5–12.5)
Monocytes Relative: 7.6 %
Neutro Abs: 6037 {cells}/uL (ref 1500–7800)
Neutrophils Relative %: 68.6 %
Platelets: 386 10*3/uL (ref 140–400)
RBC: 4.37 Million/uL (ref 3.80–5.10)
RDW: 13 % (ref 11.0–15.0)
Total Lymphocyte: 23.2 %
WBC: 8.8 10*3/uL (ref 3.8–10.8)

## 2024-07-01 LAB — LIPID PANEL
Cholesterol: 178 mg/dL
HDL: 72 mg/dL
LDL Cholesterol (Calc): 92 mg/dL
Non-HDL Cholesterol (Calc): 106 mg/dL
Total CHOL/HDL Ratio: 2.5 (calc)
Triglycerides: 57 mg/dL

## 2024-07-01 LAB — COMPREHENSIVE METABOLIC PANEL WITH GFR
AG Ratio: 1.6 (calc) (ref 1.0–2.5)
ALT: 11 U/L (ref 6–29)
AST: 15 U/L (ref 10–35)
Albumin: 3.9 g/dL (ref 3.6–5.1)
Alkaline phosphatase (APISO): 80 U/L (ref 37–153)
BUN: 12 mg/dL (ref 7–25)
CO2: 32 mmol/L (ref 20–32)
Calcium: 9.7 mg/dL (ref 8.6–10.4)
Chloride: 102 mmol/L (ref 98–110)
Creat: 0.93 mg/dL (ref 0.60–1.00)
Globulin: 2.4 g/dL (ref 1.9–3.7)
Glucose, Bld: 88 mg/dL (ref 65–99)
Potassium: 4.4 mmol/L (ref 3.5–5.3)
Sodium: 141 mmol/L (ref 135–146)
Total Bilirubin: 0.5 mg/dL (ref 0.2–1.2)
Total Protein: 6.3 g/dL (ref 6.1–8.1)
eGFR: 65 mL/min/{1.73_m2}

## 2024-07-01 LAB — TSH: TSH: 1.48 m[IU]/L (ref 0.40–4.50)

## 2024-07-01 LAB — VITAMIN D 25 HYDROXY (VIT D DEFICIENCY, FRACTURES): Vit D, 25-Hydroxy: 47 ng/mL (ref 30–100)

## 2024-07-01 MED ORDER — WEGOVY 1.5 MG PO TABS: 1.5000 mg | ORAL_TABLET | Freq: Every day | ORAL | 1 refills | Status: AC

## 2024-09-28 ENCOUNTER — Ambulatory Visit: Admitting: Family Medicine
